# Patient Record
Sex: Male | Born: 1960
Health system: Southern US, Community
[De-identification: ages and names within clinical notes are randomized; demographics above are authoritative.]

## PROBLEM LIST (undated history)

## (undated) HISTORY — PX: VASECTOMY: SHX75

---

## 2000-01-25 ENCOUNTER — Emergency Department (HOSPITAL_COMMUNITY): Admission: EM | Admit: 2000-01-25 | Discharge: 2000-01-25 | Payer: Self-pay | Admitting: Emergency Medicine

## 2000-01-25 ENCOUNTER — Encounter: Payer: Self-pay | Admitting: Emergency Medicine

## 2001-01-25 ENCOUNTER — Encounter: Payer: Self-pay | Admitting: Emergency Medicine

## 2001-01-25 ENCOUNTER — Emergency Department (HOSPITAL_COMMUNITY): Admission: EM | Admit: 2001-01-25 | Discharge: 2001-01-25 | Payer: Self-pay | Admitting: Emergency Medicine

## 2001-06-08 ENCOUNTER — Other Ambulatory Visit: Admission: RE | Admit: 2001-06-08 | Discharge: 2001-06-08 | Payer: Self-pay | Admitting: Urology

## 2002-10-06 HISTORY — PX: TONSILLECTOMY: SUR1361

## 2004-05-20 ENCOUNTER — Ambulatory Visit (HOSPITAL_BASED_OUTPATIENT_CLINIC_OR_DEPARTMENT_OTHER): Admission: RE | Admit: 2004-05-20 | Discharge: 2004-05-20 | Payer: Self-pay | Admitting: Internal Medicine

## 2005-05-07 ENCOUNTER — Ambulatory Visit (HOSPITAL_COMMUNITY): Admission: RE | Admit: 2005-05-07 | Discharge: 2005-05-08 | Payer: Self-pay | Admitting: Otolaryngology

## 2012-01-02 ENCOUNTER — Observation Stay (HOSPITAL_COMMUNITY): Payer: BC Managed Care – PPO

## 2012-01-02 ENCOUNTER — Observation Stay (HOSPITAL_COMMUNITY)
Admission: EM | Admit: 2012-01-02 | Discharge: 2012-01-02 | Disposition: A | Discharge status: Home / Self Care | Payer: BC Managed Care – PPO | Attending: Emergency Medicine | Admitting: Emergency Medicine

## 2012-01-02 ENCOUNTER — Other Ambulatory Visit: Payer: Self-pay

## 2012-01-02 ENCOUNTER — Emergency Department (HOSPITAL_COMMUNITY): Payer: BC Managed Care – PPO

## 2012-01-02 ENCOUNTER — Encounter (HOSPITAL_COMMUNITY): Payer: Self-pay | Admitting: *Deleted

## 2012-01-02 DIAGNOSIS — R079 Chest pain, unspecified: Principal | ICD-10-CM | POA: Insufficient documentation

## 2012-01-02 DIAGNOSIS — Z79899 Other long term (current) drug therapy: Secondary | ICD-10-CM | POA: Insufficient documentation

## 2012-01-02 LAB — POCT I-STAT, CHEM 8
BUN: 21 mg/dL (ref 6–23)
Chloride: 107 mEq/L (ref 96–112)
Potassium: 4.1 mEq/L (ref 3.5–5.1)
Sodium: 141 mEq/L (ref 135–145)

## 2012-01-02 LAB — POCT I-STAT TROPONIN I: Troponin i, poc: 0.01 ng/mL (ref 0.00–0.08)

## 2012-01-02 MED ORDER — METOPROLOL TARTRATE 25 MG PO TABS
100.0000 mg | ORAL_TABLET | Freq: Once | ORAL | Status: AC
  Administered 2012-01-02: 100 mg via ORAL
  Filled 2012-01-02: qty 4

## 2012-01-02 MED ORDER — METOPROLOL TARTRATE 1 MG/ML IV SOLN
5.0000 mg | Freq: Once | INTRAVENOUS | Status: AC
  Administered 2012-01-02: 5 mg via INTRAVENOUS

## 2012-01-02 MED ORDER — METOPROLOL TARTRATE 1 MG/ML IV SOLN
INTRAVENOUS | Status: AC
  Administered 2012-01-02: 5 mg via INTRAVENOUS
  Filled 2012-01-02: qty 5

## 2012-01-02 MED ORDER — METOPROLOL TARTRATE 25 MG PO TABS
50.0000 mg | ORAL_TABLET | Freq: Once | ORAL | Status: AC
  Administered 2012-01-02: 50 mg via ORAL
  Filled 2012-01-02: qty 2

## 2012-01-02 MED ORDER — NITROGLYCERIN 0.4 MG SL SUBL
0.4000 mg | SUBLINGUAL_TABLET | Freq: Once | SUBLINGUAL | Status: AC
  Administered 2012-01-02: 0.4 mg via SUBLINGUAL

## 2012-01-02 MED ORDER — IOHEXOL 350 MG/ML SOLN
80.0000 mL | Freq: Once | INTRAVENOUS | Status: AC | PRN
  Administered 2012-01-02: 80 mL via INTRAVENOUS

## 2012-01-02 NOTE — ED Notes (Signed)
Pt reports having central CP x3days, worse with exertion with associated SOB and lightheadedness.

## 2012-01-02 NOTE — ED Provider Notes (Signed)
Patient placed in CDU under chest pain protocol.  Patient scheduled for coronary CT today.  Patient currently pain-free.  Initial cardiac markers negative.  Lungs CTA bilaterally.  S1/S2, RRR, no murmur.  Abdomen soft, bowel sounds present.  Strong distal pulses palpated all extremities.  12 lead reviewed, no indication of ischemia.  Monitor reveals NSR without ectopy.  Treatment and diagnostic plan discussed with patient.  4:37 PM Received CT results from radiologist.  Normal study-- 1. No coronary artery disease. The patient's total coronary artery calcium score is 0.  2. No significant extracardiac findings.  3. Left-sided coronary artery dominance.   Results discussed with patient.  Patient will be discharged home with outpatient cardiology follow-up.  Jimmye Norman, NP 01/02/12 [phone]

## 2012-01-02 NOTE — ED Notes (Signed)
Pt d/c home in NAD. Pt voiced understanding of follow up care. Pt ambulated with quick, steady gait.

## 2012-01-02 NOTE — ED Notes (Signed)
Pt placed on monitor, continuous pulse oximetry and blood pressure cuff; family at bedside 

## 2012-01-02 NOTE — ED Provider Notes (Signed)
History     CSN: [id number]  Arrival date & time 01/02/12  1026   First MD Initiated Contact with Patient 01/02/12 1156      Chief Complaint  Patient presents with  . Chest Pain     HPI Pt reports 3day hx of intermittent midsternal chest pain. Non radiating, described as pressure. EKG NSR. Pt reports pain 0/10.  Pain started today when he was loading some bulge.  Patient has no previous history of coronary disease.  Questionable family history of heart disease.  Patient has hyperlipidemia but no hypertension no diabetes does not smoke.   History reviewed. No pertinent past medical history.  History reviewed. No pertinent past surgical history.  History reviewed. No pertinent family history.  History  Substance Use Topics  . Smoking status: Never Smoker   . Smokeless tobacco: Not on file  . Alcohol Use: Yes     occ      Review of Systems  All other systems reviewed and are negative.    Allergies  Review of patient's allergies indicates no known allergies.  Home Medications   Current Outpatient Rx  Name Route Sig Dispense Refill  . VITAMIN C PO Oral Take 1 tablet by mouth daily.    . IBUPROFEN 200 MG PO TABS Oral Take 200 mg by mouth daily.      BP 105/74  Pulse 58  Temp(Src) 98 F (36.7 C) (Oral)  Resp 16  SpO2 99%  Physical Exam  Nursing note and vitals reviewed. Constitutional: He is oriented to person, place, and time. He appears well-developed and well-nourished. No distress.  HENT:  Head: Normocephalic and atraumatic.  Eyes: Pupils are equal, round, and reactive to light.  Neck: Normal range of motion.  Cardiovascular: Normal rate and intact distal pulses.         Date: 01/02/2012  Rate: 88  Rhythm: normal sinus rhythm  QRS Axis: normal  Intervals: normal  ST/T Wave abnormalities: normal  Conduction Disutrbances: none  Narrative Interpretation: unremarkable      Pulmonary/Chest: No respiratory distress.  Abdominal: Normal appearance. He  exhibits no distension.  Musculoskeletal: Normal range of motion.  Neurological: He is alert and oriented to person, place, and time. No cranial nerve deficit.  Skin: Skin is warm and dry. No rash noted.  Psychiatric: He has a normal mood and affect. His behavior is normal.    ED Course  Procedures (including critical care time)   Labs Reviewed  POCT I-STAT, CHEM 8  POCT I-STAT TROPONIN I   Dg Chest 2 View  01/02/2012  *RADIOLOGY REPORT*  Clinical Data: 51 year old male with chest pain and pressure. Lightheaded.  CHEST - 2 VIEW  Comparison: None.  Findings: Normal lung volumes. Normal cardiac size and mediastinal contours.  Visualized tracheal air column is within normal limits. The lungs are clear.  No pneumothorax or effusion.  Mild summation artifacts suspected at the left base on the frontal view.  No acute osseous abnormality identified.  IMPRESSION: Negative, no acute cardiopulmonary abnormality.  Original Report Authenticated By: Harley Hallmark, M.D.   Ct Heart Morp W/cta Cor W/score W/ca W/cm &/or Wo/cm  01/02/2012  *RADIOLOGY REPORT*  INDICATION:  Central chest pain for 3 days.  Worse with exertion. Short of breath and lightheadedness.  CT ANGIOGRAPHY OF THE HEART, CORONARY ARTERY, STRUCTURE, AND MORPHOLOGY  CONTRAST: 80mL OMNIPAQUE IOHEXOL 350 MG/ML IV SOLN  COMPARISON:  Plain film of earlier today.  TECHNIQUE:  CT angiography of the coronary vessels was  performed on a 256 channel system using prospective ECG gating.  A scout and noncontrast exam (for calcium scoring) were performed.  Circulation time was measured using a test bolus.  Coronary CTA was performed with sub mm slice collimation during portions of the cardiac cycle after prior injection of iodinated contrast.  Imaging post processing was performed on an independent workstation creating multiplanar and 3-D images, and quantitative analysis of the heart and coronary arteries.  Note that this exam targets the heart and the chest  was not imaged in its entirety.  PREMEDICATION: Lopressor 150 mg, P.O. Lopressor 5.0 mg, IV Nitroglycerin 0.4 mcg, sublingual.  FINDINGS: Technical quality:  Excellent  Heart rate:  57  CORONARY ARTERIES: Left main coronary artery:  Moderate length vessel, which arises from the left coronary cusp and is within normal limits. Left anterior descending:  Gives rise to a moderate-sized branching first diagonal, which is normal.  Gives rise to a small branching second diagonal., which is patent.  Supplies a third diagonal, which is diminutive.  Continues as a long vessel over the cardiac apex. Left circumflex:  A large, dominant vessel.  Gives rise to SA node branch.  Immediately gives rise to a large marginal branch, which is normal.  This does have an intramuscular course.  Gives rise to a second diminutive marginal.  Continues as a moderate sized vessel to supply posterolateral branches and the PDA. Right coronary artery:  Small to moderate, nondominant vessel. Arises from right coronary cusp.  Gives rise to a small to moderate first marginal and then terminates in a second marginal branch. Negative. Posterior descending artery:  Supplied by the left. Patent. Dominance:  Left-sided  CORONARY CALCIUM:  Total Agatston Score:  0  AORTA AND PULMONARY MEASUREMENTS: Aortic root (21 - 40 mm):             25  at the annulus             33  at the sinuses of Valsalva             24  at the sinotubular junction Ascending aorta ( <  40 mm):  30 Descending aorta ( <  40 mm):  23 Main pulmonary artery:  ( <  30 mm):  21  EXTRACARDIAC FINDINGS: Lung windows demonstrate minimal subpleural nodularity in the right lower lobe on image 27, likely an area of scarring.  Soft tissue windows demonstrate no aortic dissection.  No pericardial or pleural effusion.  No imaged thoracic adenopathy. No central pulmonary embolism, on this non-dedicated study.  Limited abdominal imaging demonstrates no significant findings.  No acute osseous  abnormality.  IMPRESSION:  1.  No coronary artery disease.  The patient's total coronary artery calcium score is 0. 2.  No significant extracardiac findings. 3.  Left-sided coronary artery dominance.  Report was called to CDU mid level at [phone] at 4:30 p.m. on 01/02/2012.  Original Report Authenticated By: Consuello Bossier, M.D.     1. Chest pain       MDM          Nelia Shi, MD 01/02/12 [phone]

## 2012-01-02 NOTE — Discharge Instructions (Signed)
Chest Pain Observation It is often hard to give a specific diagnosis for the cause of chest pain. Your symptoms had a chance of being caused by inadequate oxygen delivery to your heart (angina). Angina that is not treated or evaluated can lead to a heart attack (myocardial infarction, MI) or death. Blood tests, electrocardiograms, and X-rays may have been done to help determine a possible cause of your chest pain. After evaluation and observation, your caregiver has determined that it is unlikely your pain was caused by angina. However, a full evaluation of your pain needs to be completed. You need to follow up with caregivers or diagnostic testing as directed. It is very important to keep your follow-up appointments. Not keeping your follow-up appointments could result in permanent heart damage, disability, or death. If there is any problem keeping your follow-up appointments, you must call your caregiver. HOME CARE INSTRUCTIONS  Due to the slight chance that your pain could be angina, it is important to follow healthy lifestyle habits and follow your caregiver's treatment plan:  Maintain a healthy weight.   Stay physically active and exercise regularly.   Decrease your salt intake.   Eat a diet low in saturated fats and cholesterol. Avoid foods fried in oil or made with fat. Talk to a dietician to learn about heart healthy foods.   Increase your fiber intake by including whole grains, vegetable, and fruits in your diet.   Avoid situations that cause stress, anger, or depression.   Take medication as advised by your caregiver. Report any side effects to your caregiver. Do not stop medications or adjust the dosages on your own.   Quit smoking. Do not use nicotine patches or gum until you check with your caregiver.   Keep your blood pressure, blood sugar, and cholesterol levels within normal limits.   Limit alcohol intake to no more than 1 drink per day for nonpregnant women and 2  drinks per day for men.   Stop abusing drugs.  SEEK MEDICAL CARE IF: You have severe chest pain or pressure which may include symptoms such as:  Pain or pressure in the arms, neck, jaw, or back.   Profuse sweating.   Feeling sick to your stomach (nauseous).   Feeling short of breath while at rest.   Having a fast or irregular heartbeat.   You have chest pain that does not get better after rest or after taking your usual medicine.   You wake from sleep with chest pain.   You feel dizzy, faint, or experience extreme fatigue.   You notice increasing shortness of breath during rest, sleep, or with activity.   You are unable to sleep because you cannot breathe.   You develop a frequent cough or you are coughing up blood.   You have severe back or abdominal pain, are nauseated, or throw up (vomit).   You develop severe weakness, dizziness, fainting, or chills.  Any of these symptoms may represent a serious problem that is an emergency. Do not wait to see if the symptoms will go away. Call your local emergency services (911 in the [place] Do not drive yourself to the hospital. MAKE SURE YOU:  Understand these instructions.   Will watch your condition.   Will get help right away if you are not doing well or get worse.  Document Released: 10/25/2010 Document Revised: 09/11/2011 Document Reviewed: 10/25/2010 [facility] Patient Information 2012 [place], [place].   RESOURCE GUIDE   No Primary Care Doctor Call [facility]  [phone] Other agencies that provide inexpensive medical care    [facility]  [phone]    [facility]  [phone]    [facility]  [phone]    [facility]  [phone]    [facility]  [phone]    [facility] [place] [facility]  [phone]

## 2012-01-02 NOTE — ED Notes (Signed)
Pt reports 3day hx of intermittent midsternal chest pain. Non radiating, described as pressure. EKG NSR. Pt reports pain 6/10.

## 2018-01-12 ENCOUNTER — Encounter: Payer: Self-pay | Admitting: Physician Assistant

## 2018-01-12 ENCOUNTER — Other Ambulatory Visit: Payer: Self-pay

## 2018-01-12 ENCOUNTER — Ambulatory Visit: Payer: 59 | Admitting: Physician Assistant

## 2018-01-12 VITALS — BP 120/82 | HR 76 | Temp 98.8°F | Ht 72.44 in | Wt 166.2 lb

## 2018-01-12 DIAGNOSIS — Z13220 Encounter for screening for lipoid disorders: Secondary | ICD-10-CM

## 2018-01-12 DIAGNOSIS — Z1329 Encounter for screening for other suspected endocrine disorder: Secondary | ICD-10-CM | POA: Diagnosis not present

## 2018-01-12 DIAGNOSIS — Z114 Encounter for screening for human immunodeficiency virus [HIV]: Secondary | ICD-10-CM

## 2018-01-12 DIAGNOSIS — Z23 Encounter for immunization: Secondary | ICD-10-CM | POA: Diagnosis not present

## 2018-01-12 DIAGNOSIS — Z8042 Family history of malignant neoplasm of prostate: Secondary | ICD-10-CM | POA: Diagnosis not present

## 2018-01-12 DIAGNOSIS — Z1159 Encounter for screening for other viral diseases: Secondary | ICD-10-CM

## 2018-01-12 DIAGNOSIS — Z0000 Encounter for general adult medical examination without abnormal findings: Secondary | ICD-10-CM

## 2018-01-12 DIAGNOSIS — Z1389 Encounter for screening for other disorder: Secondary | ICD-10-CM | POA: Diagnosis not present

## 2018-01-12 DIAGNOSIS — Z130 Encounter for screening for diseases of the blood and blood-forming organs and certain disorders involving the immune mechanism: Secondary | ICD-10-CM | POA: Diagnosis not present

## 2018-01-12 DIAGNOSIS — Z13228 Encounter for screening for other metabolic disorders: Secondary | ICD-10-CM | POA: Diagnosis not present

## 2018-01-12 MED ORDER — ZOSTER VAC RECOMB ADJUVANTED 50 MCG/0.5ML IM SUSR: 0.5000 mL | Freq: Once | INTRAMUSCULAR | 0 refills | Status: AC

## 2018-01-12 NOTE — Patient Instructions (Addendum)
Return in 1-2 weeks for fasting labs.   Health Maintenance, Male A healthy lifestyle and preventive care is important for your health and wellness. Ask your health care provider about what schedule of regular examinations is right for you. What should I know about weight and diet? Eat a Healthy Diet  Eat plenty of vegetables, fruits, whole grains, low-fat dairy products, and lean protein.  Do not eat a lot of foods high in solid fats, added sugars, or salt.  Maintain a Healthy Weight Regular exercise can help you achieve or maintain a healthy weight. You should:  Do at least 150 minutes of exercise each week. The exercise should increase your heart rate and make you sweat (moderate-intensity exercise).  Do strength-training exercises at least twice a week.  Watch Your Levels of Cholesterol and Blood Lipids  Have your blood tested for lipids and cholesterol every 5 years starting at 57 years of age. If you are at high risk for heart disease, you should start having your blood tested when you are 57 years old. You may need to have your cholesterol levels checked more often if: ? Your lipid or cholesterol levels are high. ? You are older than 57 years of age. ? You are at high risk for heart disease.  What should I know about cancer screening? Many types of cancers can be detected early and may often be prevented. Lung Cancer  You should be screened every year for lung cancer if: ? You are a current smoker who has smoked for at least 30 years. ? You are a former smoker who has quit within the past 15 years.  Talk to your health care provider about your screening options, when you should start screening, and how often you should be screened.  Colorectal Cancer  Routine colorectal cancer screening usually begins at 57 years of age and should be repeated every 5-10 years until you are 57 years old. You may need to be screened more often if early forms of precancerous polyps or small  growths are found. Your health care provider may recommend screening at an earlier age if you have risk factors for colon cancer.  Your health care provider may recommend using home test kits to check for hidden blood in the stool.  A small camera at the end of a tube can be used to examine your colon (sigmoidoscopy or colonoscopy). This checks for the earliest forms of colorectal cancer.  Prostate and Testicular Cancer  Depending on your age and overall health, your health care provider may do certain tests to screen for prostate and testicular cancer.  Talk to your health care provider about any symptoms or concerns you have about testicular or prostate cancer.  Skin Cancer  Check your skin from head to toe regularly.  Tell your health care provider about any new moles or changes in moles, especially if: ? There is a change in a mole's size, shape, or color. ? You have a mole that is larger than a pencil eraser.  Always use sunscreen. Apply sunscreen liberally and repeat throughout the day.  Protect yourself by wearing long sleeves, pants, a wide-brimmed hat, and sunglasses when outside.  What should I know about heart disease, diabetes, and high blood pressure?  If you are 118-57 years of age, have your blood pressure checked every 3-5 years. If you are 57 years of age or older, have your blood pressure checked every year. You should have your blood pressure measured twice-once when you  are at a hospital or clinic, and once when you are not at a hospital or clinic. Record the average of the two measurements. To check your blood pressure when you are not at a hospital or clinic, you can use: ? An automated blood pressure machine at a pharmacy. ? A home blood pressure monitor.  Talk to your health care provider about your target blood pressure.  If you are between 42-73 years old, ask your health care provider if you should take aspirin to prevent heart disease.  Have regular  diabetes screenings by checking your fasting blood sugar level. ? If you are at a normal weight and have a low risk for diabetes, have this test once every three years after the age of 67. ? If you are overweight and have a high risk for diabetes, consider being tested at a younger age or more often.  A one-time screening for abdominal aortic aneurysm (AAA) by ultrasound is recommended for men aged 65-75 years who are current or former smokers. What should I know about preventing infection? Hepatitis B If you have a higher risk for hepatitis B, you should be screened for this virus. Talk with your health care provider to find out if you are at risk for hepatitis B infection. Hepatitis C Blood testing is recommended for:  Everyone born from [birth year] through 1965.  Anyone with known risk factors for hepatitis C.  Sexually Transmitted Diseases (STDs)  You should be screened each year for STDs including gonorrhea and chlamydia if: ? You are sexually active and are younger than 57 years of age. ? You are older than 57 years of age and your health care provider tells you that you are at risk for this type of infection. ? Your sexual activity has changed since you were last screened and you are at an increased risk for chlamydia or gonorrhea. Ask your health care provider if you are at risk.  Talk with your health care provider about whether you are at high risk of being infected with HIV. Your health care provider may recommend a prescription medicine to help prevent HIV infection.  What else can I do?  Schedule regular health, dental, and eye exams.  Stay current with your vaccines (immunizations).  Do not use any tobacco products, such as cigarettes, chewing tobacco, and e-cigarettes. If you need help quitting, ask your health care provider.  Limit alcohol intake to no more than 2 drinks per day. One drink equals 12 ounces of beer, 5 ounces of wine, or 1 ounces of hard liquor.  Do not use  street drugs.  Do not share needles.  Ask your health care provider for help if you need support or information about quitting drugs.  Tell your health care provider if you often feel depressed.  Tell your health care provider if you have ever been abused or do not feel safe at home. This information is not intended to replace advice given to you by your health care provider. Make sure you discuss any questions you have with your health care provider. Document Released: 03/20/2008 Document Revised: 05/21/2016 Document Reviewed: 06/26/2015 Elsevier Interactive Patient Education  2018 [employer].     IF you received an x-ray today, you will receive an invoice from [facility]. Please contact [facility] at [phone] with questions or concerns regarding your invoice.   IF you received labwork today, you will receive an invoice from [facility]. Please contact [facility] at [phone] with questions or concerns regarding your  invoice.   Our billing staff will not be able to assist you with questions regarding bills from these companies.  You will be contacted with the lab results as soon as they are available. The fastest way to get your results is to activate your My Chart account. Instructions are located on the last page of this paperwork. If you have not heard from [place] regarding the results in 2 weeks, please contact this office.

## 2018-01-12 NOTE — Progress Notes (Signed)
Luke Gregory  MRN: [id number] DOB: [birth year]  Subjective:  Luke Gregory is a 57 y.o. male who presents for annual physical exam and to establish care. Does not have a PCP. He is not fasting today.   Diet: 3 full meals a day. Boiled eggs, grits, fish, white and red meat, vegetables, fruits, and grains. Drinks one cup of coffee in morning and water throughout the day. Does not eat a lot of dairy. Does not take any multivitamins.  Exercise: Has not worked out since the new year. He does walk a lot with his job. Typically walks 2-3 miles at work every day.   Sleep: 6 hours a night.   BM: 1-2 per day.  Urinary sx: Denies nocturia, urinary frequency, urgency, weak stream, urinary dribbling, and erectile dysfunction. Has FH of prostate cancer in father.  Chronic medical conditions: 1)HLD: Years ago but has not had blood work in years so does not know.   Last annual physical: Never Last dental exam: 2017, brushes twice daily Last vision exam: 2016 Last colonoscopy:2017, normal  Vaccinations      Tetanus: >10 years ago      Zostavax: Never   There are no active problems to display for this patient.   No current outpatient medications on file prior to visit.   No current facility-administered medications on file prior to visit.     No Known Allergies  Social History   Socioeconomic History  . Marital status: Married    Spouse name: Lattie Haw  . Number of children: 3  . Years of education: College  . Highest education level: Bachelor's degree (e.g., BA, AB, BS)  Occupational History  . Not on file  Social Needs  . Financial resource strain: Not hard at all  . Food insecurity:    Worry: Never true    Inability: Never true  . Transportation needs:    Medical: No    Non-medical: No  Tobacco Use  . Smoking status: Never Smoker  . Smokeless tobacco: Never Used  Substance and Sexual Activity  . Alcohol use: Yes    Alcohol/week: 7.2 oz    Types: 12 Standard drinks or  equivalent per week    Comment: occ  . Drug use: Never  . Sexual activity: Yes    Partners: Female    Birth control/protection: Surgical    Comment: with monagamous partner  Lifestyle  . Physical activity:    Days per week: 0 days    Minutes per session: 0 min  . Stress: Not at all  Relationships  . Social connections:    Talks on phone: More than three times a week    Gets together: More than three times a week    Attends religious service: Never    Active member of club or organization: No    Attends meetings of clubs or organizations: Never    Relationship status: Married  Other Topics Concern  . Not on file  Social History Narrative   Pt is from [place]. Lives at home with wife and one son. Works for his own company. Has a good friend group.     Past Surgical History:  Procedure Laterality Date  . TONSILLECTOMY  2004  . VASECTOMY      Family History  Problem Relation Age of Onset  . Lung cancer Mother   . COPD Mother   . Heart disease Father   . Diabetes Father   . Prostate cancer Father   . Multiple  sclerosis Sister     Review of Systems  Constitutional: Negative for activity change, appetite change, chills, diaphoresis, fatigue, fever and unexpected weight change.  HENT: Negative for congestion, dental problem, drooling, ear discharge, ear pain, facial swelling, hearing loss, mouth sores, nosebleeds, postnasal drip, rhinorrhea, sinus pressure, sinus pain, sneezing, sore throat, tinnitus, trouble swallowing and voice change.   Eyes: Negative for photophobia, pain, discharge, redness, itching and visual disturbance.  Respiratory: Negative for apnea, cough, choking, chest tightness, shortness of breath, wheezing and stridor.   Cardiovascular: Negative for chest pain, palpitations and leg swelling.  Gastrointestinal: Negative for abdominal distention, abdominal pain, anal bleeding, blood in stool, constipation, diarrhea, nausea, rectal pain and vomiting.    Endocrine: Negative for cold intolerance, heat intolerance, polydipsia, polyphagia and polyuria.  Genitourinary: Negative for decreased urine volume, difficulty urinating, discharge, dysuria, enuresis, flank pain, frequency, genital sores, hematuria, penile pain, penile swelling, scrotal swelling, testicular pain and urgency.  Musculoskeletal: Negative for arthralgias, back pain, gait problem, joint swelling, myalgias, neck pain and neck stiffness.  Skin: Negative for color change, pallor, rash and wound.  Allergic/Immunologic: Negative for environmental allergies, food allergies and immunocompromised state.  Neurological: Negative for dizziness, tremors, seizures, syncope, facial asymmetry, speech difficulty, weakness, light-headedness, numbness and headaches.  Hematological: Negative for adenopathy. Does not bruise/bleed easily.  Psychiatric/Behavioral: Negative for agitation, behavioral problems, confusion, decreased concentration, dysphoric mood, hallucinations, self-injury, sleep disturbance and suicidal ideas. The patient is not nervous/anxious and is not hyperactive.     Objective:  BP 120/82 (BP Location: Left Arm, Patient Position: Sitting, Cuff Size: Normal)   Pulse 76   Temp 98.8 F (37.1 C) (Oral)   Ht 6' 0.44" (1.84 m)   Wt 166 lb 3.2 oz (75.4 kg)   SpO2 98%   BMI 22.27 kg/m   Physical Exam  Constitutional: He is oriented to person, place, and time. He appears well-developed and well-nourished.  HENT:  Head: Normocephalic and atraumatic.  Right Ear: Hearing, tympanic membrane, external ear and ear canal normal.  Left Ear: Hearing, tympanic membrane, external ear and ear canal normal.  Nose: Nose normal.  Mouth/Throat: Uvula is midline, oropharynx is clear and moist and mucous membranes are normal. No oropharyngeal exudate.  Eyes: Pupils are equal, round, and reactive to light. Conjunctivae and EOM are normal.  Neck: Trachea normal and normal range of motion.   Cardiovascular: Normal rate, regular rhythm, normal heart sounds and intact distal pulses.  Pulmonary/Chest: Effort normal and breath sounds normal.  Abdominal: Soft. Normal appearance and bowel sounds are normal.  Genitourinary:  Genitourinary Comments: Deferred GU exam   Musculoskeletal: Normal range of motion.  Lymphadenopathy:       Head (right side): No submental, no submandibular, no tonsillar, no preauricular, no posterior auricular and no occipital adenopathy present.       Head (left side): No submental, no submandibular, no tonsillar, no preauricular, no posterior auricular and no occipital adenopathy present.    He has no cervical adenopathy.       Right: No supraclavicular adenopathy present.       Left: No supraclavicular adenopathy present.  Neurological: He is alert and oriented to person, place, and time. He has normal strength and normal reflexes.  Skin: Skin is warm and dry.  Psychiatric: He has a normal mood and affect.  Vitals reviewed.  No exam data present  Assessment and Plan :  Discussed healthy lifestyle, diet, exercise, preventative care, vaccinations, and addressed patient's concerns. Plan for follow up in one  year. Otherwise, plan for specific conditions below. 1. Annual physical exam Return within next two weeks for fasting labs.  2. Screening, anemia, deficiency, iron - CBC with Differential/Platelet; Future  3. Screening cholesterol level - Lipid panel; Future  4. Screening for thyroid disorder - TSH; Future  5. Screening for metabolic disorder - OIL57+VJKQ; Future  6. Screening for hematuria or proteinuria - POCT urinalysis dipstick; Future  7. Screening for HIV (human immunodeficiency virus) - HIV antibody; Future  8. Need for hepatitis C screening test - Hepatitis C antibody; Future  9. Need for shingles vaccine - Zoster Vaccine Adjuvanted [facility]) injection; Inject 0.5 mLs into the muscle once for 1 dose.  Dispense: 0.5 mL; Refill:  0  10. Family history of prostate cancer Declines both PSA test and rectal exam.   Tenna Delaine, PA-C  [facility] 01/12/2018 3:15 PM

## 2018-01-12 NOTE — Addendum Note (Signed)
Addended by: Shon HaleWELLS, Marrion Finan L on: 01/12/2018 03:19 PM   Modules accepted: Orders

## 2018-01-13 ENCOUNTER — Ambulatory Visit: Payer: 59

## 2018-01-13 DIAGNOSIS — Z13220 Encounter for screening for lipoid disorders: Secondary | ICD-10-CM

## 2018-01-13 DIAGNOSIS — Z1389 Encounter for screening for other disorder: Secondary | ICD-10-CM

## 2018-01-13 DIAGNOSIS — Z13228 Encounter for screening for other metabolic disorders: Secondary | ICD-10-CM

## 2018-01-13 DIAGNOSIS — Z1329 Encounter for screening for other suspected endocrine disorder: Secondary | ICD-10-CM

## 2018-01-13 DIAGNOSIS — Z130 Encounter for screening for diseases of the blood and blood-forming organs and certain disorders involving the immune mechanism: Secondary | ICD-10-CM

## 2018-01-13 DIAGNOSIS — Z1159 Encounter for screening for other viral diseases: Secondary | ICD-10-CM | POA: Diagnosis not present

## 2018-01-13 DIAGNOSIS — Z114 Encounter for screening for human immunodeficiency virus [HIV]: Secondary | ICD-10-CM

## 2018-01-13 LAB — POCT URINALYSIS DIP (MANUAL ENTRY)
BILIRUBIN UA: NEGATIVE
Blood, UA: NEGATIVE
GLUCOSE UA: NEGATIVE mg/dL
Ketones, POC UA: NEGATIVE mg/dL
LEUKOCYTES UA: NEGATIVE
NITRITE UA: NEGATIVE
Protein Ur, POC: NEGATIVE mg/dL
Spec Grav, UA: 1.02 (ref 1.010–1.025)
Urobilinogen, UA: 0.2 E.U./dL
pH, UA: 5.5 (ref 5.0–8.0)

## 2018-01-14 LAB — CBC WITH DIFFERENTIAL/PLATELET
BASOS ABS: 0 10*3/uL (ref 0.0–0.2)
Basos: 1 %
EOS (ABSOLUTE): 0.2 10*3/uL (ref 0.0–0.4)
Eos: 4 %
Hematocrit: 41.7 % (ref 37.5–51.0)
Hemoglobin: 14 g/dL (ref 13.0–17.7)
IMMATURE GRANS (ABS): 0 10*3/uL (ref 0.0–0.1)
IMMATURE GRANULOCYTES: 0 %
LYMPHS: 45 %
Lymphocytes Absolute: 2 10*3/uL (ref 0.7–3.1)
MCH: 31 pg (ref 26.6–33.0)
MCHC: 33.6 g/dL (ref 31.5–35.7)
MCV: 92 fL (ref 79–97)
MONOS ABS: 0.4 10*3/uL (ref 0.1–0.9)
Monocytes: 9 %
NEUTROS ABS: 1.8 10*3/uL (ref 1.4–7.0)
NEUTROS PCT: 41 %
Platelets: 242 10*3/uL (ref 150–379)
RBC: 4.52 x10E6/uL (ref 4.14–5.80)
RDW: 13.7 % (ref 12.3–15.4)
WBC: 4.4 10*3/uL (ref 3.4–10.8)

## 2018-01-14 LAB — CMP14+EGFR
A/G RATIO: 1.8 (ref 1.2–2.2)
ALT: 49 IU/L — AB (ref 0–44)
AST: 41 IU/L — AB (ref 0–40)
Albumin: 4.3 g/dL (ref 3.5–5.5)
Alkaline Phosphatase: 101 IU/L (ref 39–117)
BUN/Creatinine Ratio: 13 (ref 9–20)
BUN: 10 mg/dL (ref 6–24)
Bilirubin Total: 0.3 mg/dL (ref 0.0–1.2)
CO2: 25 mmol/L (ref 20–29)
Calcium: 9.5 mg/dL (ref 8.7–10.2)
Chloride: 104 mmol/L (ref 96–106)
Creatinine, Ser: 0.76 mg/dL (ref 0.76–1.27)
GFR calc Af Amer: 118 mL/min/{1.73_m2} (ref >59)
GFR calc non Af Amer: 102 mL/min/{1.73_m2} (ref >59)
GLOBULIN, TOTAL: 2.4 g/dL (ref 1.5–4.5)
Glucose: 80 mg/dL (ref 65–99)
POTASSIUM: 4.8 mmol/L (ref 3.5–5.2)
SODIUM: 143 mmol/L (ref 134–144)
Total Protein: 6.7 g/dL (ref 6.0–8.5)

## 2018-01-14 LAB — LIPID PANEL
CHOL/HDL RATIO: 2.7 ratio (ref 0.0–5.0)
Cholesterol, Total: 226 mg/dL — ABNORMAL HIGH (ref 100–199)
HDL: 85 mg/dL (ref >39)
LDL CALC: 126 mg/dL — AB (ref 0–99)
TRIGLYCERIDES: 75 mg/dL (ref 0–149)
VLDL CHOLESTEROL CAL: 15 mg/dL (ref 5–40)

## 2018-01-14 LAB — TSH: TSH: 2.01 u[IU]/mL (ref 0.450–4.500)

## 2018-01-14 LAB — HIV ANTIBODY (ROUTINE TESTING W REFLEX): HIV SCREEN 4TH GENERATION: NONREACTIVE

## 2018-01-14 LAB — HEPATITIS C ANTIBODY: Hep C Virus Ab: <0.1 s/co ratio (ref 0.0–0.9)

## 2018-08-02 DIAGNOSIS — H40033 Anatomical narrow angle, bilateral: Secondary | ICD-10-CM | POA: Diagnosis not present

## 2018-08-02 DIAGNOSIS — H04123 Dry eye syndrome of bilateral lacrimal glands: Secondary | ICD-10-CM | POA: Diagnosis not present

## 2020-10-24 ENCOUNTER — Emergency Department (HOSPITAL_BASED_OUTPATIENT_CLINIC_OR_DEPARTMENT_OTHER)
Admission: EM | Admit: 2020-10-24 | Discharge: 2020-10-24 | Disposition: A | Discharge status: Home / Self Care | Payer: Worker's Compensation | Attending: Emergency Medicine | Admitting: Emergency Medicine

## 2020-10-24 ENCOUNTER — Emergency Department (HOSPITAL_BASED_OUTPATIENT_CLINIC_OR_DEPARTMENT_OTHER): Payer: Worker's Compensation

## 2020-10-24 ENCOUNTER — Other Ambulatory Visit: Payer: Self-pay

## 2020-10-24 ENCOUNTER — Encounter (HOSPITAL_BASED_OUTPATIENT_CLINIC_OR_DEPARTMENT_OTHER): Payer: Self-pay

## 2020-10-24 DIAGNOSIS — W000XXA Fall on same level due to ice and snow, initial encounter: Secondary | ICD-10-CM | POA: Insufficient documentation

## 2020-10-24 DIAGNOSIS — M25532 Pain in left wrist: Secondary | ICD-10-CM | POA: Diagnosis present

## 2020-10-24 DIAGNOSIS — S52502A Unspecified fracture of the lower end of left radius, initial encounter for closed fracture: Secondary | ICD-10-CM | POA: Diagnosis not present

## 2020-10-24 NOTE — ED Triage Notes (Signed)
Pt reports slipped on the ice and fell onto his Left Wrist.

## 2020-10-24 NOTE — ED Provider Notes (Signed)
[facility] EMERGENCY DEPARTMENT Provider Note   CSN: [id number] Arrival date & time: 10/24/20  0755     History Chief Complaint  Patient presents with  . Wrist Pain  . Fall    Luke Gregory is a 60 y.o. male.  HPI Patient presents after a slip on the ice and fall.  Landed onto her left wrist.  Pain in the left wrist.  No other injury.  No neck back chest or abdominal pain.  Does have some abrasions on the hand and forearm but states these are from a new puppy that he has at home.  No numbness or weakness but pain when he moves the wrist.    History reviewed. No pertinent past medical history.  There are no problems to display for this patient.   Past Surgical History:  Procedure Laterality Date  . TONSILLECTOMY  2004  . VASECTOMY         Family History  Problem Relation Age of Onset  . Lung cancer Mother   . COPD Mother   . Heart disease Father   . Diabetes Father   . Prostate cancer Father   . Multiple sclerosis Sister     Social History   Tobacco Use  . Smoking status: Never Smoker  . Smokeless tobacco: Never Used  Vaping Use  . Vaping Use: Never used  Substance Use Topics  . Alcohol use: Yes    Alcohol/week: 12.0 standard drinks    Types: 12 Standard drinks or equivalent per week    Comment: occ  . Drug use: Never    Home Medications Prior to Admission medications   Not on File    Allergies    Patient has no known allergies.  Review of Systems   Review of Systems  Constitutional: Negative for appetite change.  HENT: Negative for congestion.   Respiratory: Negative for shortness of breath.   Cardiovascular: Negative for chest pain.  Gastrointestinal: Negative for abdominal pain.  Musculoskeletal: Negative for back pain and neck pain.       Left wrist pain and swelling.  Skin: Positive for wound.  Neurological: Negative for weakness and numbness.  Hematological: Does not bruise/bleed easily.    Physical Exam Updated Vital  Signs BP (!) 154/88   Pulse 94   Temp 98.4 F (36.9 C) (Oral)   Resp 18   Ht 6' (1.829 m)   Wt 74.8 kg   SpO2 96%   BMI 22.38 kg/m   Physical Exam Vitals and nursing note reviewed.  Constitutional:      Appearance: Normal appearance.  HENT:     Head: Atraumatic.  Eyes:     General: No scleral icterus. Chest:     Chest wall: No tenderness.  Abdominal:     Tenderness: There is no abdominal tenderness.  Musculoskeletal:     Cervical back: Neck supple.     Comments: Tenderness over left wrist particularly of the radial aspect.  Abrasions to hand and wrist.  Skin otherwise intact.  Reported puppy bites for the cause of the abrasions.  Ring on fourth finger.  Patient will remove.  Sensation grossly intact over hand.  Skin:    Capillary Refill: Capillary refill takes less than 2 seconds.  Neurological:     Mental Status: He is oriented to person, place, and time.     ED Results / Procedures / Treatments   Labs (all labs ordered are listed, but only abnormal results are displayed) Labs Reviewed - No  data to display  EKG None  Radiology DG Wrist Complete Left  Result Date: 10/24/2020 CLINICAL DATA:  RIGHT wrist pain and swelling, fell this morning and tried to catch himself with LEFT hand EXAM: LEFT WRIST - COMPLETE 3+ VIEW COMPARISON:  None FINDINGS: Osseous mineralization low normal. Joint spaces preserved. Tiny avulsion fracture at tip of ulnar styloid process. Nondisplaced distal LEFT radial metaphyseal fracture. No additional fracture, dislocation, or bone destruction. Metallic foreign body 6 mm length at the ulnar margin of the LEFT hand at the level of the fifth MCP joint. IMPRESSION: Nondisplaced distal LEFT radial metaphyseal fracture. Tiny avulsion fracture at tip of LEFT ulnar styloid process. Electronically Signed   By: Ulyses Southward M.D.   On: 10/24/2020 08:34    Procedures Procedures (including critical care time)  Medications Ordered in ED Medications - No data  to display  ED Course  I have reviewed the triage vital signs and the nursing notes.  Pertinent labs & imaging results that were available during my care of the patient were reviewed by me and considered in my medical decision making (see chart for details).    MDM Rules/Calculators/A&P                          Patient with mechanical fall on ice.  Nondisplaced radial metaphyseal fracture and small avulsion fracture of left ulnar styloid.  Immobilized.  Outpatient follow-up with sports medicine. Final Clinical Impression(s) / ED Diagnoses Final diagnoses:  Closed fracture of distal end of left radius, unspecified fracture morphology, initial encounter    Rx / DC Orders ED Discharge Orders    None       Benjiman Core, MD 10/24/20 [phone]

## 2020-10-24 NOTE — ED Notes (Signed)
Ring is off left hand

## 2020-10-31 ENCOUNTER — Ambulatory Visit (INDEPENDENT_AMBULATORY_CARE_PROVIDER_SITE_OTHER): Payer: Worker's Compensation | Admitting: Family Medicine

## 2020-10-31 ENCOUNTER — Telehealth: Payer: Self-pay | Admitting: Family Medicine

## 2020-10-31 ENCOUNTER — Ambulatory Visit (HOSPITAL_BASED_OUTPATIENT_CLINIC_OR_DEPARTMENT_OTHER)
Admission: RE | Admit: 2020-10-31 | Discharge: 2020-10-31 | Disposition: A | Discharge status: Home / Self Care | Payer: Worker's Compensation | Source: Ambulatory Visit | Attending: Family Medicine | Admitting: Family Medicine

## 2020-10-31 ENCOUNTER — Other Ambulatory Visit: Payer: Self-pay

## 2020-10-31 VITALS — BP 100/60 | Ht 72.0 in | Wt 168.0 lb

## 2020-10-31 DIAGNOSIS — S52592A Other fractures of lower end of left radius, initial encounter for closed fracture: Secondary | ICD-10-CM | POA: Insufficient documentation

## 2020-10-31 DIAGNOSIS — S52502A Unspecified fracture of the lower end of left radius, initial encounter for closed fracture: Secondary | ICD-10-CM | POA: Insufficient documentation

## 2020-10-31 NOTE — Patient Instructions (Signed)
Nice to meet you Please try tylenol   Please send me a message in MyChart with any questions or updates.  Please see me back in 2 weeks.   --Dr. Jordan Likes

## 2020-10-31 NOTE — Progress Notes (Signed)
  BENNO BRENSINGER - 60 y.o. male MRN [id number]  Date of birth: [birth year]  SUBJECTIVE:  Including CC & ROS.  No chief complaint on file.   Luke Gregory is a 60 y.o. male that is presenting with left wrist pain after a fall at work.  He sustained a fracture was placed in a sugar tong splint at the urgent care.  Pain is under control and has little to no pain currently.  No numbness or tingling.  Independent review of the left wrist x-ray from 1/19 shows a nondisplaced distal radius fracture.   Review of Systems See HPI   HISTORY: Past Medical, Surgical, Social, and Family History Reviewed & Updated per EMR.   Pertinent Historical Findings include:  No past medical history on file.  Past Surgical History:  Procedure Laterality Date  . TONSILLECTOMY  2004  . VASECTOMY      Family History  Problem Relation Age of Onset  . Lung cancer Mother   . COPD Mother   . Heart disease Father   . Diabetes Father   . Prostate cancer Father   . Multiple sclerosis Sister     Social History   Socioeconomic History  . Marital status: Married    Spouse name: Misty Stanley  . Number of children: 3  . Years of education: College  . Highest education level: Bachelor's degree (e.g., BA, AB, BS)  Occupational History  . Not on file  Tobacco Use  . Smoking status: Never Smoker  . Smokeless tobacco: Never Used  Vaping Use  . Vaping Use: Never used  Substance and Sexual Activity  . Alcohol use: Yes    Alcohol/week: 12.0 standard drinks    Types: 12 Standard drinks or equivalent per week    Comment: occ  . Drug use: Never  . Sexual activity: Yes    Partners: Female    Birth control/protection: Surgical    Comment: with monagamous partner  Other Topics Concern  . Not on file  Social History Narrative   Pt is from [place]. Lives at home with wife and one son. Works for his own company. Has a good friend group.    Social Determinants of Health   Financial Resource Strain: Not on file   Food Insecurity: Not on file  Transportation Needs: Not on file  Physical Activity: Not on file  Stress: Not on file  Social Connections: Not on file  Intimate Partner Violence: Not on file     PHYSICAL EXAM:  VS: BP 100/60   Ht 6' (1.829 m)   Wt 168 lb (76.2 kg)   BMI 22.78 kg/m  Physical Exam Gen: NAD, alert, cooperative with exam, well-appearing MSK:  Left wrist limited Obvious swelling and ecchymosis of the distal radius. Limited flexion and extension. Normal thumb abduction. Neurovascular intact     ASSESSMENT & PLAN:   Closed fracture of left distal radius Injury occurred on 1/19.  Injury occurred at work.  Independent review of the x-ray today shows nondisplaced.  Radiology review shows possible more complicated picture. -Counseled on supportive care. - exos brace today. -We will pursue CT scan

## 2020-10-31 NOTE — Telephone Encounter (Signed)
Informed that CT scan showed possible variation and intra articular process. Will obtain CT to better evaluate fracture.   Luke Rude, MD [facility] 10/31/2020, 2:53 PM

## 2020-10-31 NOTE — Assessment & Plan Note (Signed)
Injury occurred on 1/19.  Injury occurred at work.  Independent review of the x-ray today shows nondisplaced.  Radiology review shows possible more complicated picture. -Counseled on supportive care. - exos brace today. -We will pursue CT scan

## 2020-11-07 ENCOUNTER — Telehealth (INDEPENDENT_AMBULATORY_CARE_PROVIDER_SITE_OTHER): Payer: Worker's Compensation | Admitting: Family Medicine

## 2020-11-07 ENCOUNTER — Encounter: Payer: Self-pay | Admitting: Family Medicine

## 2020-11-07 ENCOUNTER — Other Ambulatory Visit: Payer: Self-pay

## 2020-11-07 DIAGNOSIS — S52592D Other fractures of lower end of left radius, subsequent encounter for closed fracture with routine healing: Secondary | ICD-10-CM

## 2020-11-07 NOTE — Progress Notes (Signed)
Virtual Visit via Video Note  I connected with Luke Gregory on 11/07/20 at  8:00 AM EST by a video enabled telemedicine application and verified that I am speaking with the correct person using two identifiers.  Location: Patient: work Provider: office   I discussed the limitations of evaluation and management by telemedicine and the availability of in person appointments. The patient expressed understanding and agreed to proceed.  History of Present Illness:  Luke Gregory is a 60 year old male that is following up after the CT scan of his left wrist.  It was showing a mildly comminuted fracture of the left distal radius with extension into the margin of the distal radial ulnar joint.  Also showing a small avulsion of the distal ulnar styloid of unknown age.   Observations/Objective:  Gen: NAD, alert, cooperative with exam, well-appearing   Assessment and Plan:  Closed fracture left distal radius: Injury occurred on 1/19.  Injury occurred at work.  CT scan was showing a commniuted fracture involving the joint  -Counseled supportive care - referral to ortho   Follow Up Instructions:    I discussed the assessment and treatment plan with the patient. The patient was provided an opportunity to ask questions and all were answered. The patient agreed with the plan and demonstrated an understanding of the instructions.   The patient was advised to call back or seek an in-person evaluation if the symptoms worsen or if the condition fails to improve as anticipated.    Luke Gandy, MD

## 2020-11-07 NOTE — Assessment & Plan Note (Addendum)
Injury occurred on 1/19.  Injury occurred at work.  CT scan was showing a commniuted fracture involving the joint  -Counseled supportive care - referral to ortho

## 2020-11-14 ENCOUNTER — Telehealth: Payer: Worker's Compensation | Admitting: Family Medicine

## 2020-11-14 ENCOUNTER — Other Ambulatory Visit: Payer: Self-pay

## 2020-11-28 NOTE — Addendum Note (Signed)
Addended by: Shon Hale on: 11/28/2020 10:20 AM   Modules accepted: Orders

## 2021-11-05 IMAGING — DX DG WRIST COMPLETE 3+V*L*
4 series · 4 of 4 positions shown · non-contrast
Comparison: 10/24/2020
COMPARISON: 10/24/2020

Addendum:
CLINICAL DATA: LEFT wrist pain 1 week after being casted for a
distal radial fracture

EXAM:
LEFT WRIST - COMPLETE 3+ VIEW

[wrist pa]
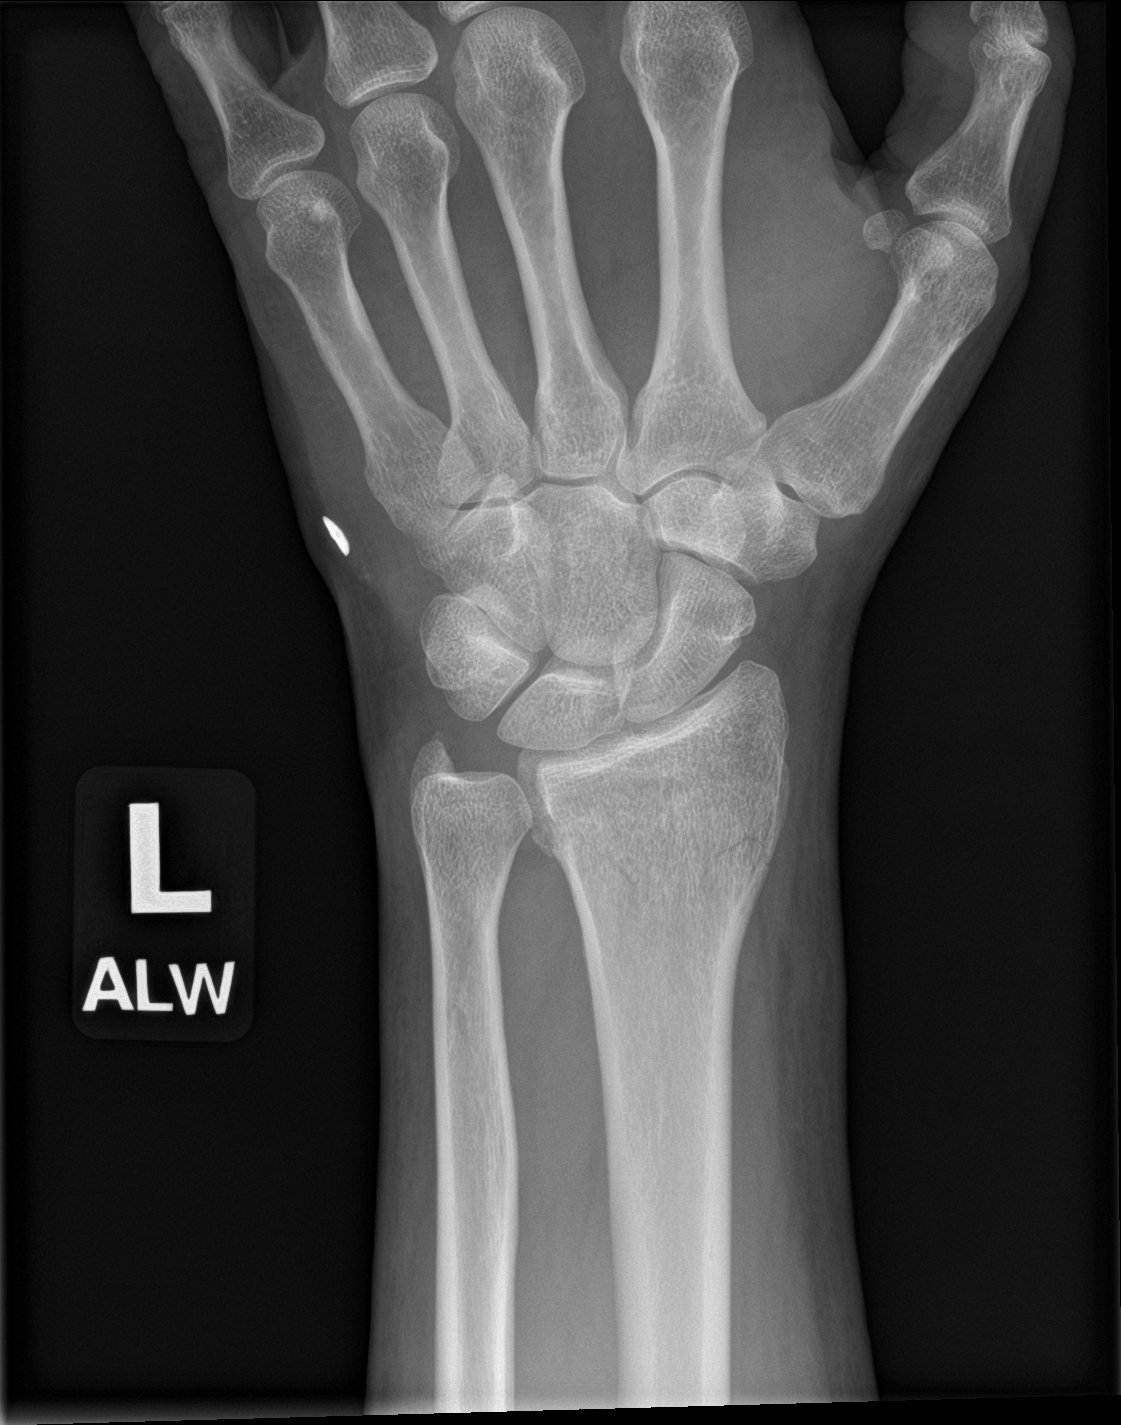

[wrist obl]
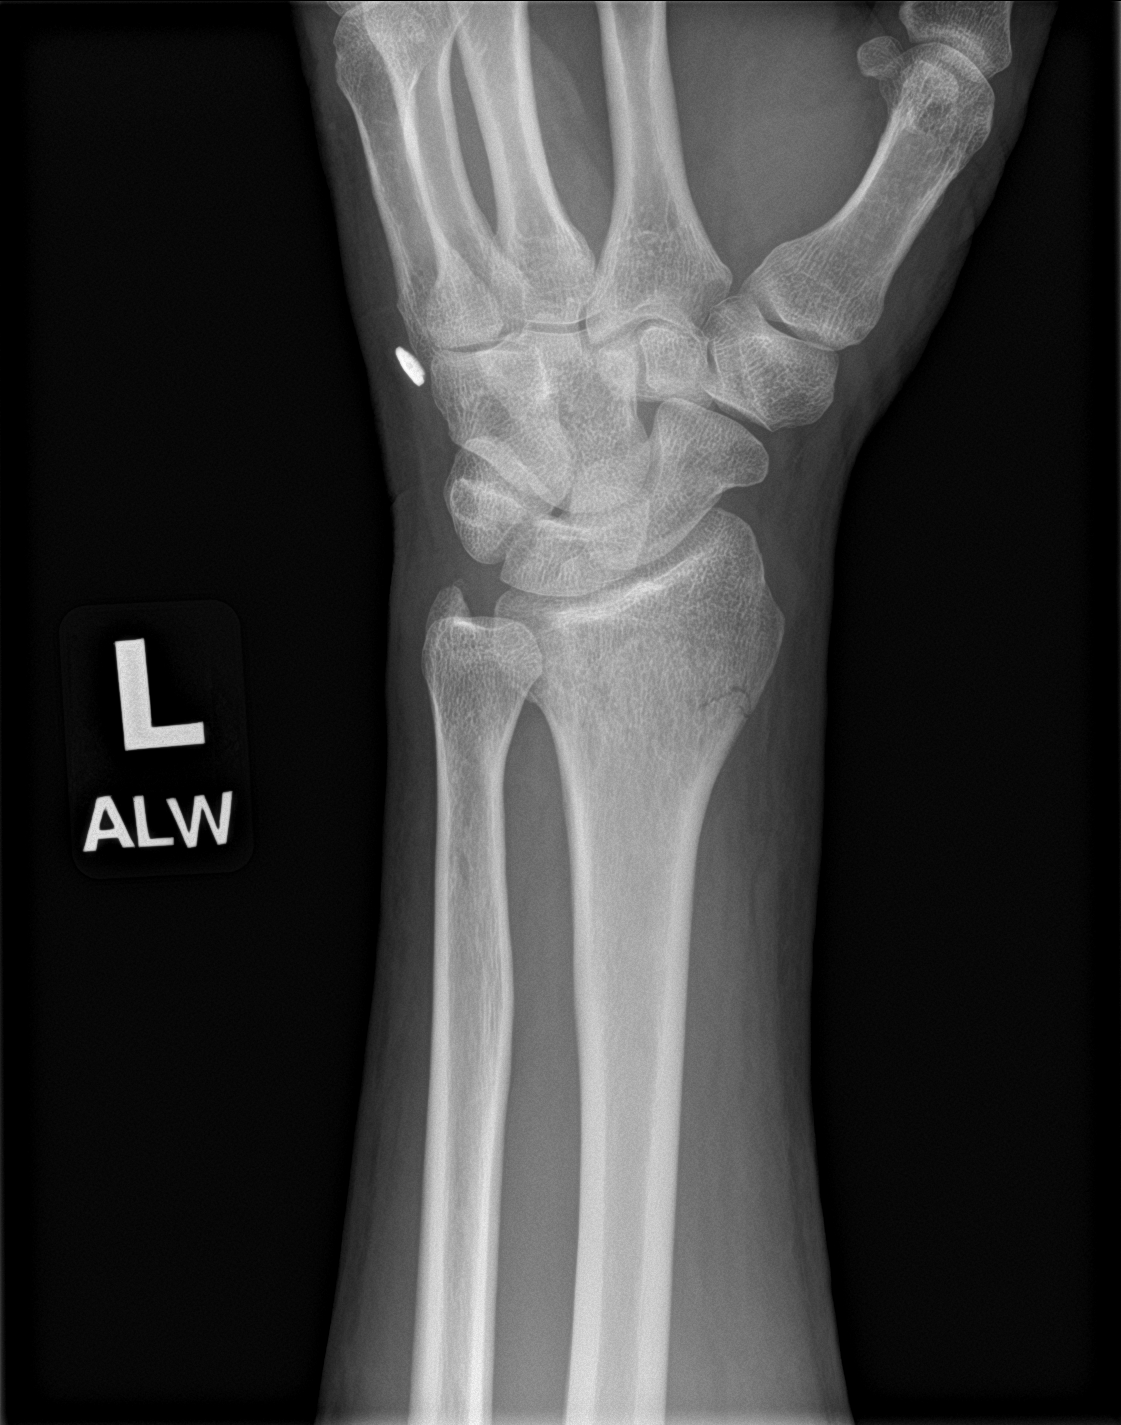

[wrist lat]
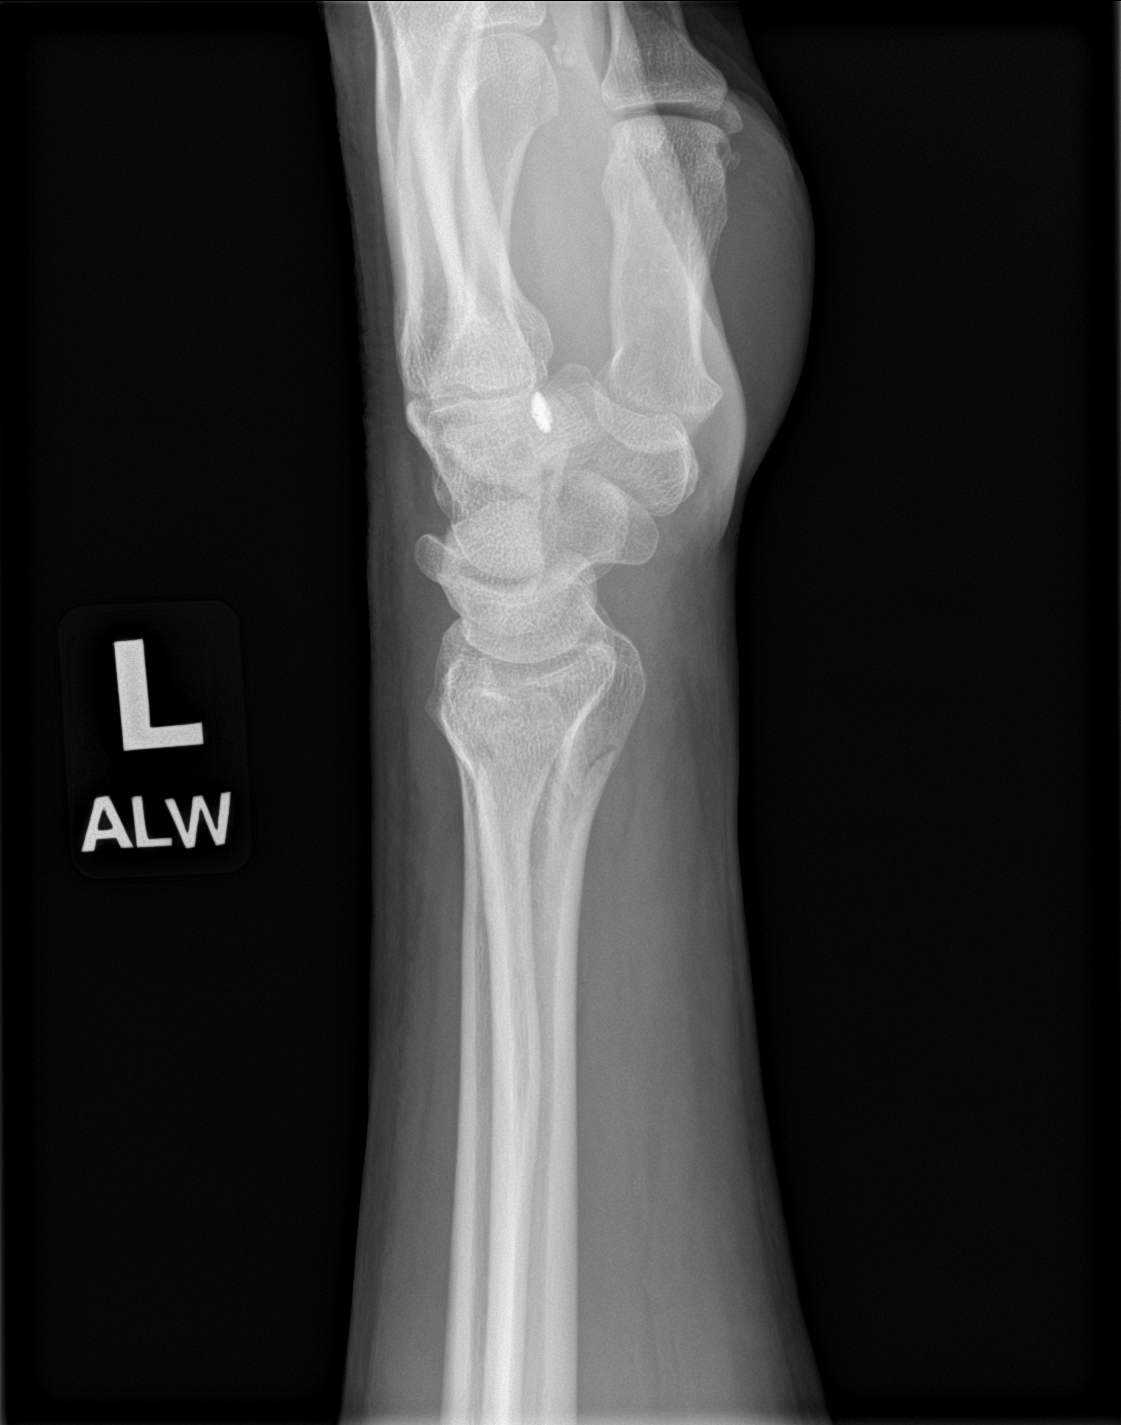

[wrist navicular]
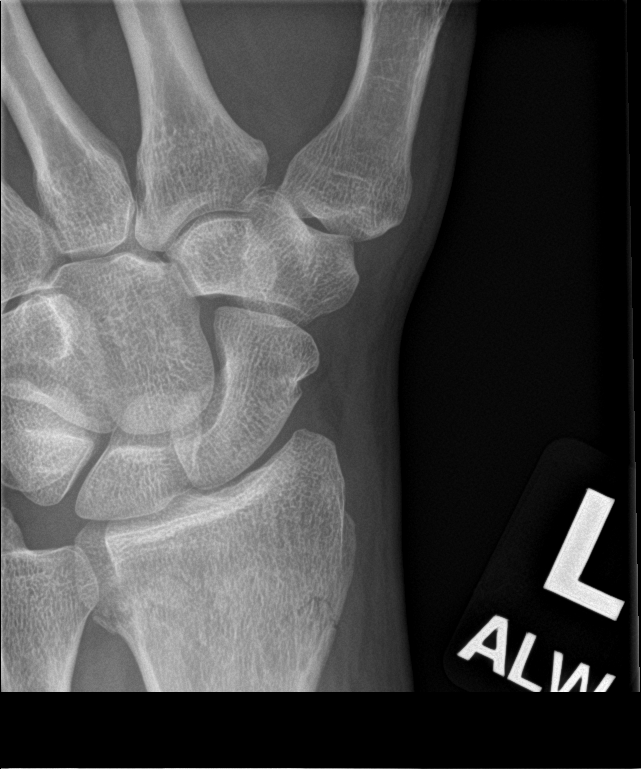

[4 of 4 positions shown; findings below may reference images not displayed]

FINDINGS: Impacted LEFT distal radius fracture with horizontal component in
the radial metaphysis. Potential T-shaped variation with fracture
line potentially extending into the mid radial articular surface
best seen on AP view.

Radiopaque 6 mm metallic foreign body along the ulnar aspect of the
wrist in the soft tissues with similar appearance.
IMPRESSION: 1. Impacted LEFT distal radius fracture without change in alignment.
2. Potential T-shaped variation with fracture line potentially
extending into the mid radial articular surface. CT may be helpful
for further assessment.
3. Radiopaque foreign body in the soft tissues appears metallic and
is unchanged as described.

results will be called to the ordering clinician or representative
by the Radiologist Assistant, and communication documented in the
PACS or [REDACTED].

ADDENDUM:
Small avulsion of the ulnar styloid as before.

*** End of Addendum ***
FINDINGS: Impacted LEFT distal radius fracture with horizontal component in
the radial metaphysis. Potential T-shaped variation with fracture
line potentially extending into the mid radial articular surface
best seen on AP view.

Radiopaque 6 mm metallic foreign body along the ulnar aspect of the
wrist in the soft tissues with similar appearance.
IMPRESSION: 1. Impacted LEFT distal radius fracture without change in alignment.
2. Potential T-shaped variation with fracture line potentially
extending into the mid radial articular surface. CT may be helpful
for further assessment.
3. Radiopaque foreign body in the soft tissues appears metallic and
is unchanged as described.

results will be called to the ordering clinician or representative
by the Radiologist Assistant, and communication documented in the
PACS or [REDACTED].

## 2022-07-01 NOTE — Progress Notes (Unsigned)
[place] Phone: [phone] Subjective:   [name], am serving as a scribe for Dr. Hulan Saas.  I'm seeing this patient by the request  of:  Patient, No Pcp Per  CC: Bilateral knee pain  BTD:[id number]  Luke Gregory is a 61 y.o. male coming in with complaint of B knee pain. Patient states that pain is throughout the joint. Pain worse when sitting for prolonged periods and at night. Had cortisone injection 17 years ago. Has not tried any tx for pain recently.       No past medical history on file. Past Surgical History:  Procedure Laterality Date   TONSILLECTOMY  2004   VASECTOMY     Social History   Socioeconomic History   Marital status: Married    Spouse name: Lattie Haw   Number of children: 3   Years of education: [employer] education level: Bachelor's degree (e.g., BA, AB, BS)  Occupational History   Not on file  Tobacco Use   Smoking status: Never   Smokeless tobacco: Never  Vaping Use   Vaping Use: Never used  Substance and Sexual Activity   Alcohol use: Yes    Alcohol/week: 12.0 standard drinks of alcohol    Types: 12 Standard drinks or equivalent per week    Comment: occ   Drug use: Never   Sexual activity: Yes    Partners: Female    Birth control/protection: Surgical    Comment: with monagamous partner  Other Topics Concern   Not on file  Social History Narrative   Pt is from [place]. Lives at home with wife and one son. Works for his own company. Has a good friend group.    Social Determinants of Health   Financial Resource Strain: Low Risk  (01/12/2018)   Overall Financial Resource Strain (CARDIA)    Difficulty of Paying Living Expenses: Not hard at all  Food Insecurity: No Food Insecurity (01/12/2018)   Hunger Vital Sign    Worried About Running Out of Food in the Last Year: Never true    Ran Out of Food in the Last Year: Never true  Transportation Needs: No  Transportation Needs (01/12/2018)   PRAPARE - Hydrologist (Medical): No    Lack of Transportation (Non-Medical): No  Physical Activity: Inactive (01/12/2018)   Exercise Vital Sign    Days of Exercise per Week: 0 days    Minutes of Exercise per Session: 0 min  Stress: No Stress Concern Present (01/12/2018)   [place]    Feeling of Stress : Not at all  Social Connections: Somewhat Isolated (01/12/2018)   Social Connection and Isolation Panel [NHANES]    Frequency of Communication with Friends and Family: More than three times a week    Frequency of Social Gatherings with Friends and Family: More than three times a week    Attends Religious Services: Never    Marine scientist or Organizations: No    Attends Music therapist: Never    Marital Status: Married   No Known Allergies Family History  Problem Relation Age of Onset   Lung cancer Mother    COPD Mother    Heart disease Father    Diabetes Father    Prostate cancer Father    Multiple sclerosis Sister        Current Outpatient Medications (Analgesics):  meloxicam (MOBIC) 15 MG tablet, Take 1 tablet (15 mg total) by mouth daily.     Reviewed prior external information including notes and imaging from  primary care provider As well as notes that were available from care everywhere and other healthcare systems.  Past medical history, social, surgical and family history all reviewed in electronic medical record.  No pertanent information unless stated regarding to the chief complaint.   Review of Systems:  No headache, visual changes, nausea, vomiting, diarrhea, constipation, dizziness, abdominal pain, skin rash, fevers, chills, night sweats, weight loss, swollen lymph nodes, body aches, joint swelling, chest pain, shortness of breath, mood changes. POSITIVE muscle aches  Objective  Blood pressure 108/72, pulse  91, height 6' (1.829 m), weight 171 lb (77.6 kg), SpO2 97 %.   General: No apparent distress alert and oriented x3 mood and affect normal, dressed appropriately.  HEENT: Pupils equal, extraocular movements intact  Respiratory: Patient's speak in full sentences and does not appear short of breath  Cardiovascular: No lower extremity edema, non tender, no erythema   Knees show the patient does have some spurring noted on the medial aspect of the knee.  Mild tenderness to palpation in the area.  Crepitus noted with range of motion no significant instability  Limited muscular skeletal ultrasound was performed and interpreted by Hulan Saas, M   Limited ultrasound of patient's knees bilaterally showed trace hypoechoic changes in the patellofemoral joint.  Mild narrowing of the patellofemoral joint and mild narrowing of the medial joint space left greater than right.  Mild degenerative changes of the meniscus.  Minimal displacement noted. Impression: Nonspecific mild arthritis and degenerative changes of the meniscus  After informed written and verbal consent, patient was seated on exam table. Right knee was prepped with alcohol swab and utilizing anterolateral approach, patient's right knee space was injected with 4:1  marcaine 0.5%: Kenalog 40mg /dL. Patient tolerated the procedure well without immediate complications.  After informed written and verbal consent, patient was seated on exam table. Left knee was prepped with alcohol swab and utilizing anterolateral approach, patient's left knee space was injected with 4:1  marcaine 0.5%: Kenalog 40mg /dL. Patient tolerated the procedure well without immediate complications.    Impression and Recommendations:     The above documentation has been reviewed and is accurate and complete Luke Pulley, DO

## 2022-07-02 ENCOUNTER — Ambulatory Visit (INDEPENDENT_AMBULATORY_CARE_PROVIDER_SITE_OTHER): Payer: BC Managed Care – PPO | Admitting: Family Medicine

## 2022-07-02 ENCOUNTER — Ambulatory Visit: Payer: Self-pay

## 2022-07-02 ENCOUNTER — Ambulatory Visit (INDEPENDENT_AMBULATORY_CARE_PROVIDER_SITE_OTHER): Payer: BC Managed Care – PPO

## 2022-07-02 ENCOUNTER — Encounter: Payer: Self-pay | Admitting: Family Medicine

## 2022-07-02 VITALS — BP 108/72 | HR 91 | Ht 72.0 in | Wt 171.0 lb

## 2022-07-02 DIAGNOSIS — M25562 Pain in left knee: Secondary | ICD-10-CM | POA: Diagnosis not present

## 2022-07-02 DIAGNOSIS — M25561 Pain in right knee: Secondary | ICD-10-CM | POA: Diagnosis not present

## 2022-07-02 DIAGNOSIS — G8929 Other chronic pain: Secondary | ICD-10-CM

## 2022-07-02 MED ORDER — MELOXICAM 15 MG PO TABS: 15.0000 mg | ORAL_TABLET | Freq: Every day | ORAL | 0 refills | Status: DC

## 2022-07-02 NOTE — Patient Instructions (Addendum)
Xray today Injection in both knees Do prescribed exercises at least 3x a week Meloxicam 15mg  take on trip in case you need it

## 2022-07-02 NOTE — Assessment & Plan Note (Signed)
Patient has had more pain seem to be at night.  Seems to be only around the knees.  More anteriorly than posteriorly.  Patient tries to stay active.  States that if he puts pressure on the ground has some discomfort.  On ultrasound.  Mild effusion noted.  Given injections with patient.  Going to be traveling.  Meloxicam given as well.  We will get x-rays to further evaluate.  We will follow-up again in 6 to 8 weeks.  I do anticipate patient doing relatively well overall with conservative therapy.

## 2022-07-29 ENCOUNTER — Other Ambulatory Visit: Payer: Self-pay | Admitting: Family Medicine

## 2022-12-30 DIAGNOSIS — R070 Pain in throat: Secondary | ICD-10-CM | POA: Diagnosis not present

## 2022-12-30 DIAGNOSIS — S59912A Unspecified injury of left forearm, initial encounter: Secondary | ICD-10-CM | POA: Diagnosis not present

## 2022-12-30 DIAGNOSIS — J029 Acute pharyngitis, unspecified: Secondary | ICD-10-CM | POA: Diagnosis not present

## 2022-12-30 DIAGNOSIS — S8992XA Unspecified injury of left lower leg, initial encounter: Secondary | ICD-10-CM | POA: Diagnosis not present

## 2022-12-30 DIAGNOSIS — S59911A Unspecified injury of right forearm, initial encounter: Secondary | ICD-10-CM | POA: Diagnosis not present

## 2023-01-19 ENCOUNTER — Encounter: Payer: Self-pay | Admitting: *Deleted

## 2023-10-06 ENCOUNTER — Encounter (HOSPITAL_BASED_OUTPATIENT_CLINIC_OR_DEPARTMENT_OTHER): Payer: Self-pay

## 2023-10-06 ENCOUNTER — Ambulatory Visit (HOSPITAL_BASED_OUTPATIENT_CLINIC_OR_DEPARTMENT_OTHER)
Admission: RE | Admit: 2023-10-06 | Discharge: 2023-10-06 | Disposition: A | Discharge status: Home / Self Care | Payer: BC Managed Care – PPO | Source: Ambulatory Visit | Attending: Internal Medicine | Admitting: Internal Medicine

## 2023-10-06 VITALS — BP 159/89 | HR 83 | Temp 98.5°F | Resp 20 | Wt 165.0 lb

## 2023-10-06 DIAGNOSIS — L03114 Cellulitis of left upper limb: Secondary | ICD-10-CM | POA: Diagnosis not present

## 2023-10-06 DIAGNOSIS — M79674 Pain in right toe(s): Secondary | ICD-10-CM | POA: Diagnosis not present

## 2023-10-06 MED ORDER — PREDNISONE 20 MG PO TABS: 40.0000 mg | ORAL_TABLET | Freq: Every day | ORAL | 0 refills | Status: AC

## 2023-10-06 MED ORDER — DOXYCYCLINE HYCLATE 100 MG PO CAPS: 100.0000 mg | ORAL_CAPSULE | Freq: Two times a day (BID) | ORAL | 0 refills | Status: DC

## 2023-10-06 NOTE — ED Triage Notes (Signed)
Non-healing wound to left lower arm x 3 months. +drainage. +redness. Also having right great toe redness, pain x 3 days. Reports has had similar previously but has not had it evaluated. Concerned for gout. Patient has no family physician.

## 2023-10-06 NOTE — ED Provider Notes (Signed)
 [facility]    CSN: [id number] Arrival date & time: 10/06/23  0846      History   Chief Complaint Chief Complaint  Patient presents with   Foot Pain    Also infected  sore on left arm - Entered by patient    HPI Luke Gregory is a 62 y.o. male.   Luke Gregory is a 62 y.o. male presenting for chief complaint of pain to right great toe that started 3 days ago. Pain is associated with swelling, warmth, and redness to the dorsal DIP joint of the right great toe. No recent injuries to the great toe or abrasions. No history of gout. Admits to increased alcohol over the last 1-2 weeks given the holiday season.  No history of renal problems.  He does not take daily medications.  Additionally wound to the left forearm is started draining purulent/yellow drainage.  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  Wound opened up 2 to 3 days ago.  No recent fevers, chills, body aches, nausea, or vomiting.  Denies history of diabetes and immunosuppression, although he does not have a family medicine provider and has not had a physical in a long time.  Denies recent antibiotic or steroid use.     History reviewed. No pertinent past medical history.  Patient Active Problem List   Diagnosis Date Noted   Bilateral knee pain 07/02/2022   Closed fracture of left distal radius 10/31/2020    Past Surgical History:  Procedure Laterality Date   TONSILLECTOMY  2004   VASECTOMY         Home Medications    Prior to Admission medications   Medication Sig Start Date End Date Taking? Authorizing Provider  doxycycline  (VIBRAMYCIN ) 100 MG capsule Take 1 capsule (100 mg total) by mouth 2 (two) times daily. 10/06/23  Yes Enedelia Dorna HERO, FNP  predniSONE  (DELTASONE ) 20 MG tablet Take 2 tablets (40 mg total) by mouth daily with breakfast for 5 days. 10/06/23 10/11/23 Yes Rion Catala,  Dorna HERO, FNP  meloxicam  (MOBIC ) 15 MG tablet TAKE 1 TABLET (15 MG TOTAL) BY MOUTH DAILY. 07/29/22   Claudene Arthea HERO, DO    Family History Family History  Problem Relation Age of Onset   Lung cancer Mother    COPD Mother    Heart disease Father    Diabetes Father    Prostate cancer Father    Multiple sclerosis Sister     Social History Social History   Tobacco Use   Smoking status: Never   Smokeless tobacco: Never  Vaping Use   Vaping status: Never Used  Substance Use Topics   Alcohol use: Yes    Alcohol/week: 12.0 standard drinks of alcohol    Types: 12 Standard drinks or equivalent per week    Comment: occ   Drug use: Never     Allergies   Patient has no known allergies.   Review of Systems Review of Systems Per HPI  Physical Exam Triage Vital Signs ED Triage Vitals  Encounter Vitals Group     BP 10/06/23 0903 (!) 159/89     Systolic BP Percentile --      Diastolic BP Percentile --      Pulse Rate 10/06/23 0903 83     Resp 10/06/23 0903 20     Temp 10/06/23 0903 98.5 F (  36.9 C)     Temp Source 10/06/23 0903 Oral     SpO2 10/06/23 0903 96 %     Weight 10/06/23 0904 165 lb (74.8 kg)     Height --      Head Circumference --      Peak Flow --      Pain Score 10/06/23 0904 2     Pain Loc --      Pain Education --      Exclude from Growth Chart --    No data found.  Updated Vital Signs BP (!) 159/89 (BP Location: Right Arm)   Pulse 83   Temp 98.5 F (36.9 C) (Oral)   Resp 20   Wt 165 lb (74.8 kg)   SpO2 96%   BMI 22.38 kg/m   Visual Acuity Right Eye Distance:   Left Eye Distance:   Bilateral Distance:    Right Eye Near:   Left Eye Near:    Bilateral Near:     Physical Exam Vitals and nursing note reviewed.  Constitutional:      Appearance: He is not ill-appearing or toxic-appearing.  HENT:     Head: Normocephalic and atraumatic.     Right Ear: Hearing, tympanic membrane, ear canal and external ear normal.     Left Ear:  Hearing, tympanic membrane, ear canal and external ear normal.     Nose: Nose normal.     Mouth/Throat:     Lips: Pink.     Mouth: Mucous membranes are moist. No injury or oral lesions.     Dentition: Normal dentition.     Tongue: No lesions.     Pharynx: Oropharynx is clear. Uvula midline. No pharyngeal swelling, oropharyngeal exudate, posterior oropharyngeal erythema, uvula swelling or postnasal drip.     Tonsils: No tonsillar exudate.  Eyes:     General: Lids are normal. Vision grossly intact. Gaze aligned appropriately.     Extraocular Movements: Extraocular movements intact.     Conjunctiva/sclera: Conjunctivae normal.  Neck:     Trachea: Trachea and phonation normal.  Cardiovascular:     Rate and Rhythm: Normal rate and regular rhythm.     Pulses:          Dorsalis pedis pulses are 2+ on the right side.     Heart sounds: Normal heart sounds, S1 normal and S2 normal.  Pulmonary:     Effort: Pulmonary effort is normal. No respiratory distress.     Breath sounds: Normal breath sounds and air entry.  Musculoskeletal:     Cervical back: Neck supple.  Feet:     Right foot:     Skin integrity: Erythema and warmth present.     Comments: Warmth, erythema, and swelling to the DIP joint of the right great toe as seen in image below.  Sensation and strength intact distally.  Significantly decreased range of motion to the right great toe secondary to pain and swelling.  Capillary refill is less than 3.  No streaking or lymphangitis. Lymphadenopathy:     Cervical: No cervical adenopathy.  Skin:    General: Skin is warm and dry.     Capillary Refill: Capillary refill takes less than 2 seconds.     Findings: Wound present. No rash.          Comments: Wound to left forearm with surrounding area of erythema and underlying soft tissue firm swelling to palpation.  Wound feels warm to touch and there is evidence of purulent drainage centrally.  Neurological:     General: No focal deficit  present.     Mental Status: He is alert and oriented to person, place, and time. Mental status is at baseline.     Cranial Nerves: No dysarthria or facial asymmetry.  Psychiatric:        Mood and Affect: Mood normal.        Speech: Speech normal.        Behavior: Behavior normal.        Thought Content: Thought content normal.        Judgment: Judgment normal.    Wound to left forearm   Right great toe    UC Treatments / Results  Labs (all labs ordered are listed, but only abnormal results are displayed) Labs Reviewed - No data to display  EKG   Radiology No results found.  Procedures Procedures (including critical care time)  Medications Ordered in UC Medications - No data to display  Initial Impression / Assessment and Plan / UC Course  I have reviewed the triage vital signs and the nursing notes.  Pertinent labs & imaging results that were available during my care of the patient were reviewed by me and considered in my medical decision making (see chart for details).   1.  Toe pain Unclear etiology of right great toe pain, although suspect acute gouty arthritis given recent increase in alcohol and red meat intake. Low suspicion for cellulitis/septic arthritis. Prednisone  burst ordered to be taken as prescribed with food for the next 5 days. Elevation, ice, and compression recommended. Advised to schedule appointment with a primary care provider for follow-up and ongoing evaluation of symptoms.  2.  Cellulitis of left arm, chronic wound Wound appears to be infected.  Doxycycline  twice daily for 7 days ordered. Warm compresses and topical Vaseline recommended. Tdap is up-to-date (2 years ago). Infection return precautions discussed.  Counseled patient on potential for adverse effects with medications prescribed/recommended today, strict ER and return-to-clinic precautions discussed, patient verbalized understanding.    Final Clinical Impressions(s) / UC  Diagnoses   Final diagnoses:  Toe pain, right  Cellulitis of arm, left     Discharge Instructions      You have a skin infection. Take antibiotic as prescribed with food. Over the counter medicines as needed for pain and inflammation. Warm compresses to the site to promote wound healing. Gently rinse site 2 times a day and wash with warm soapy water. Pat dry. Do not scrub site.  I suspect your toe pain is due to gout which is buildup of uric acid crystals in the toe joint. Take prednisone  once a day for 5 days with food. No ibuprofen when taking prednisone .  Take tylenol as needed. Refer to list of low purine foods to eat to avoid future gout attacks.  If you notice new/worsening redness, swelling, pus, pain, fever/chills, or any new or worsening symptoms, return to urgent care. For severe symptoms, go to ER. Follow-up with PCP as needed. I hope you feel better!      ED Prescriptions     Medication Sig Dispense Auth. Provider   predniSONE  (DELTASONE ) 20 MG tablet Take 2 tablets (40 mg total) by mouth daily with breakfast for 5 days. 10 tablet Enedelia Dorna HERO, FNP   doxycycline  (VIBRAMYCIN ) 100 MG capsule Take 1 capsule (100 mg total) by mouth 2 (two) times daily. 20 capsule Enedelia Dorna HERO, FNP      PDMP not reviewed this encounter.   Enedelia Dorna HERO,  FNP 10/06/23 [phone]

## 2023-10-06 NOTE — Discharge Instructions (Addendum)
 You have a skin infection. Take antibiotic as prescribed with food. Over the counter medicines as needed for pain and inflammation. Warm compresses to the site to promote wound healing. Gently rinse site 2 times a day and wash with warm soapy water. Pat dry. Do not scrub site.  I suspect your toe pain is due to gout which is buildup of uric acid crystals in the toe joint. Take prednisone  once a day for 5 days with food. No ibuprofen when taking prednisone .  Take tylenol as needed. Refer to list of low purine foods to eat to avoid future gout attacks.  If you notice new/worsening redness, swelling, pus, pain, fever/chills, or any new or worsening symptoms, return to urgent care. For severe symptoms, go to ER. Follow-up with PCP as needed. I hope you feel better!

## 2023-10-22 ENCOUNTER — Encounter (HOSPITAL_BASED_OUTPATIENT_CLINIC_OR_DEPARTMENT_OTHER): Payer: Self-pay | Admitting: Student

## 2023-10-22 ENCOUNTER — Ambulatory Visit (INDEPENDENT_AMBULATORY_CARE_PROVIDER_SITE_OTHER): Payer: BC Managed Care – PPO | Admitting: Student

## 2023-10-22 VITALS — BP 132/86 | HR 77 | Temp 97.9°F | Ht 70.67 in | Wt 169.7 lb

## 2023-10-22 DIAGNOSIS — E7800 Pure hypercholesterolemia, unspecified: Secondary | ICD-10-CM | POA: Diagnosis not present

## 2023-10-22 DIAGNOSIS — M10061 Idiopathic gout, right knee: Secondary | ICD-10-CM

## 2023-10-22 DIAGNOSIS — Z131 Encounter for screening for diabetes mellitus: Secondary | ICD-10-CM

## 2023-10-22 DIAGNOSIS — Z7689 Persons encountering health services in other specified circumstances: Secondary | ICD-10-CM

## 2023-10-22 MED ORDER — INDOMETHACIN ER 75 MG PO CPCR: 75.0000 mg | ORAL_CAPSULE | Freq: Every day | ORAL | 0 refills | Status: AC

## 2023-10-22 NOTE — Patient Instructions (Addendum)
It was nice to see you today!  As we discussed in clinic I will send in the indomethacin to use once daily, please let me know in a few days if it isn't working for you.  We will communicate your lab results in the next week via either mychart or by phone.  If you have any problems before your next visit feel free to message me via MyChart (minor issues or questions) or call the office, otherwise you may reach out to schedule an office visit.  Thank you! Gerilyn Pilgrim Kaleeya Hancock, PA-C

## 2023-10-22 NOTE — Assessment & Plan Note (Addendum)
Suspect gouty arthritis of knee joint. Will assess for infectious pathologies as well. Will assess for msk issues in the future if not improved. Do not suspect msk issues as there has been no trauma, locking, popping of the knee- possible tendonitis. Order indomethacin. Order uric acid level due to repeated gout flairs. Order CBC and CMP to assess for infectious or other pathologies. Patient is to let us know if knee is not improving in a few days- will assess further if not improved.  Provided with educational handout on low purine diet.

## 2023-10-22 NOTE — Progress Notes (Addendum)
New Patient Office Visit  Subjective    Patient ID: Luke Gregory, male    DOB: Mar 19, 1961  Age: 63 y.o. MRN: [id number]  CC:  Chief Complaint  Patient presents with   Establish Care    Here to establish care. Right knee hurts. Can hardly walk. Came on New Year's Eve for gout right toe and a wound on left arm.     HPI Luke Gregory presents to establish care. Prior PCP was none. Last physical was none. he notes that he requires refills of none.  Recent UC Visit- Was seen in the urgent care on 10/05/2023 for suspected cellulitis of the left arm (treatment 100mg  doxy) and R great toe pain - suspected gout (treatment prednisone burst). Patient notes that the wound had been there since September of 2024. Has noticed that the wound is now healing and he has been washing it. Toe pain was associated with recent increase in alcohol and red meat intake. Notes that prednisone alleviated toe pain. Notes that he had a wound that he needed antibiotics for in the past as well due to lack of healing.  Elevated BP reading- BP was 159/89 at UC visit. Today's BP is 132/86, patient is still in pain, suspect it is likely decreased at home.  Screenings:  Colon Cancer: Colonoscopy 2017- 2027 Lung Cancer: no Breast Cancer: no Diabetes: Indicated HLD: Indicated. Notes that he had high cholesterol years ago.   Acute Problems:  R knee pain- Patient has history of chronic b/l knee pain related to arthritis but states that this feels different. Notes that worsening pain in knee started on Sunday. Notes onset after increasing alcohol intake again. Last couple days it has worsened. Patient states that it has been "warm" but does not noted swelling (no noted warmth on exam). No trauma. Has tried ibuprofen and ice. Slight trouble bending knee. No fever or chills. No history of kidney dz or GI bleed per patient.  07/04/22 - [place] Bilateral Knees- Nonspecific mild arthritis and degenerative changes of the  meniscus. 07/03/22 - Xray Bilateral Knees AP- Negative.  Outpatient Encounter Medications as of 10/22/2023  Medication Sig   ibuprofen (ADVIL) 200 MG tablet Take 800 mg by mouth as needed.   indomethacin (INDOCIN SR) 75 MG CR capsule Take 1 capsule (75 mg total) by mouth daily with breakfast.   [DISCONTINUED] doxycycline (VIBRAMYCIN) 100 MG capsule Take 1 capsule (100 mg total) by mouth 2 (two) times daily.   [DISCONTINUED] meloxicam (MOBIC) 15 MG tablet TAKE 1 TABLET (15 MG TOTAL) BY MOUTH DAILY.   No facility-administered encounter medications on file as of 10/22/2023.    History reviewed. No pertinent past medical history.  Past Surgical History:  Procedure Laterality Date   TONSILLECTOMY  2004   VASECTOMY      Family History  Problem Relation Age of Onset   Lung cancer Mother    COPD Mother    Heart disease Father    Diabetes Father    Prostate cancer Father    Multiple sclerosis Sister     Social History   Socioeconomic History   Marital status: Married    Spouse name: Misty Stanley   Number of children: 3   Years of education: College   Highest education level: Bachelor's degree (e.g., BA, AB, BS)  Occupational History   Not on file  Tobacco Use   Smoking status: Never    Passive exposure: Never   Smokeless tobacco: Never  Vaping Use   Vaping  status: Never Used  Substance and Sexual Activity   Alcohol use: Yes    Alcohol/week: 12.0 standard drinks of alcohol    Types: 12 Standard drinks or equivalent per week    Comment: occ   Drug use: Never   Sexual activity: Yes    Partners: Female    Birth control/protection: Surgical    Comment: with monagamous partner  Other Topics Concern   Not on file  Social History Narrative   [place]    Social Drivers of Corporate investment banker Strain: Low Risk  (01/12/2018)   Overall Financial Resource Strain (CARDIA)    Difficulty of Paying Living Expenses: Not hard at all  Food Insecurity: No Food Insecurity  (10/22/2023)   Hunger Vital Sign    Worried About Running Out of Food in the Last Year: Never true    Ran Out of Food in the Last Year: Never true  Transportation Needs: No Transportation Needs (10/22/2023)   PRAPARE - Administrator, Civil Service (Medical): No    Lack of Transportation (Non-Medical): No  Physical Activity: Inactive (01/12/2018)   Exercise Vital Sign    Days of Exercise per Week: 0 days    Minutes of Exercise per Session: 0 min  Stress: No Stress Concern Present (01/12/2018)   [employer] of Occupational Health - Occupational Stress Questionnaire    Feeling of Stress : Not at all  Social Connections: Unknown (02/14/2022)   Received from [facility]   Social Network    Social Network: Not on file  Intimate Partner Violence: Not At Risk (10/22/2023)   Humiliation, Afraid, Rape, and Kick questionnaire    Fear of Current or Ex-Partner: No    Emotionally Abused: No    Physically Abused: No    Sexually Abused: No    ROS  Per HPI      Objective    BP 132/86 (BP Location: Right Arm, Patient Position: Sitting, Cuff Size: Normal)   Pulse 77   Temp 97.9 F (36.6 C) (Oral)   Ht 5' 10.67" (1.795 m)   Wt 169 lb 11.2 oz (77 kg)   SpO2 97%   BMI 23.89 kg/m   Physical Exam Constitutional:      General: He is not in acute distress.    Appearance: Normal appearance. He is not ill-appearing or toxic-appearing.  HENT:     Head: Normocephalic and atraumatic.     Right Ear: External ear normal.     Left Ear: External ear normal.     Nose: Nose normal.     Mouth/Throat:     Mouth: Mucous membranes are moist.     Pharynx: Oropharynx is clear.  Eyes:     General: No scleral icterus.    Extraocular Movements: Extraocular movements intact.     Conjunctiva/sclera: Conjunctivae normal.     Pupils: Pupils are equal, round, and reactive to light.  Neck:     Vascular: No carotid bruit.  Cardiovascular:     Rate and Rhythm: Normal rate and  regular rhythm.     Pulses: Normal pulses.     Heart sounds: Normal heart sounds. No murmur heard.    No friction rub.  Pulmonary:     Effort: Pulmonary effort is normal. No respiratory distress.     Breath sounds: Normal breath sounds. No wheezing, rhonchi or rales.  Musculoskeletal:        General: Normal range of motion.     Cervical back:  Neck supple.     Right lower leg: No edema.     Left lower leg: No edema.     Comments: Pain with flexion of R knee. No pain upon extension of R knee. Pain to palpation directly above R patella.  Skin:    General: Skin is warm and dry.     Coloration: Skin is not jaundiced or pale.     Findings: Lesion present. No erythema or rash.     Comments: Wound on arm looks much better today and has started to close up.   Toe less erythematous today.  No breaks in skin barrier of toe or knee.  Neurological:     General: No focal deficit present.     Mental Status: He is alert.  Psychiatric:        Mood and Affect: Mood normal.        Behavior: Behavior normal.         Assessment & Plan:   Encounter to establish care  Acute idiopathic gout of right knee Assessment & Plan: Suspect gouty arthritis of knee joint. Will assess for infectious pathologies as well. Will assess for msk issues in the future if not improved. Do not suspect msk issues as there has been no trauma, locking, popping of the knee- possible tendonitis. Order indomethacin. Order uric acid level due to repeated gout flairs. Order CBC and CMP to assess for infectious or other pathologies. Patient is to let us know if knee is not improving in a few days- will assess further if not improved.  Provided with educational handout on low purine diet.   Orders: -     CBC with Differential/Platelet -     Comprehensive metabolic panel -     Uric acid -     Indomethacin ER; Take 1 capsule (75 mg total) by mouth daily with breakfast.  Dispense: 10 capsule; Refill: 0  Pure  hypercholesterolemia -     Lipid panel  Screening for diabetes mellitus -     Hemoglobin A1c   I have spent greater than 45 minutes charting, educating, diagnosing and managing this patient for this visit.   Return in about 6 weeks (around 12/03/2023) for Annual Physical.   Teryl Lucy Megan Presti, PA-C

## 2023-10-23 LAB — HEMOGLOBIN A1C
Est. average glucose Bld gHb Est-mCnc: 117 mg/dL
Hgb A1c MFr Bld: 5.7 % — ABNORMAL HIGH (ref 4.8–5.6)

## 2023-10-23 LAB — CBC WITH DIFFERENTIAL/PLATELET
Basophils Absolute: 0.1 10*3/uL (ref 0.0–0.2)
Basos: 1 %
EOS (ABSOLUTE): 0.4 10*3/uL (ref 0.0–0.4)
Eos: 5 %
Hematocrit: 44.7 % (ref 37.5–51.0)
Hemoglobin: 15.2 g/dL (ref 13.0–17.7)
Immature Grans (Abs): 0 10*3/uL (ref 0.0–0.1)
Immature Granulocytes: 1 %
Lymphocytes Absolute: 2.2 10*3/uL (ref 0.7–3.1)
Lymphs: 31 %
MCH: 31 pg (ref 26.6–33.0)
MCHC: 34 g/dL (ref 31.5–35.7)
MCV: 91 fL (ref 79–97)
Monocytes Absolute: 0.4 10*3/uL (ref 0.1–0.9)
Monocytes: 5 %
Neutrophils Absolute: 4 10*3/uL (ref 1.4–7.0)
Neutrophils: 57 %
Platelets: 304 10*3/uL (ref 150–450)
RBC: 4.91 x10E6/uL (ref 4.14–5.80)
RDW: 12.3 % (ref 11.6–15.4)
WBC: 7.1 10*3/uL (ref 3.4–10.8)

## 2023-10-23 LAB — COMPREHENSIVE METABOLIC PANEL
ALT: 42 [IU]/L (ref 0–44)
AST: 35 [IU]/L (ref 0–40)
Albumin: 4.2 g/dL (ref 3.9–4.9)
Alkaline Phosphatase: 109 [IU]/L (ref 44–121)
BUN/Creatinine Ratio: 14 (ref 10–24)
BUN: 12 mg/dL (ref 8–27)
Bilirubin Total: 0.6 mg/dL (ref 0.0–1.2)
CO2: 23 mmol/L (ref 20–29)
Calcium: 9.5 mg/dL (ref 8.6–10.2)
Chloride: 103 mmol/L (ref 96–106)
Creatinine, Ser: 0.85 mg/dL (ref 0.76–1.27)
Globulin, Total: 2.4 g/dL (ref 1.5–4.5)
Glucose: 97 mg/dL (ref 70–99)
Potassium: 4.5 mmol/L (ref 3.5–5.2)
Sodium: 142 mmol/L (ref 134–144)
Total Protein: 6.6 g/dL (ref 6.0–8.5)
eGFR: 98 mL/min/{1.73_m2} (ref >59)

## 2023-10-23 LAB — LIPID PANEL
Chol/HDL Ratio: 5.1 {ratio} — ABNORMAL HIGH (ref 0.0–5.0)
Cholesterol, Total: 246 mg/dL — ABNORMAL HIGH (ref 100–199)
HDL: 48 mg/dL (ref >39)
LDL Chol Calc (NIH): 156 mg/dL — ABNORMAL HIGH (ref 0–99)
Triglycerides: 227 mg/dL — ABNORMAL HIGH (ref 0–149)
VLDL Cholesterol Cal: 42 mg/dL — ABNORMAL HIGH (ref 5–40)

## 2023-10-23 LAB — URIC ACID: Uric Acid: 6.8 mg/dL (ref 3.8–8.4)

## 2023-10-26 ENCOUNTER — Encounter (HOSPITAL_BASED_OUTPATIENT_CLINIC_OR_DEPARTMENT_OTHER): Payer: Self-pay | Admitting: Student

## 2023-10-26 ENCOUNTER — Other Ambulatory Visit (HOSPITAL_BASED_OUTPATIENT_CLINIC_OR_DEPARTMENT_OTHER): Payer: Self-pay | Admitting: Student

## 2023-10-26 DIAGNOSIS — E782 Mixed hyperlipidemia: Secondary | ICD-10-CM | POA: Insufficient documentation

## 2023-10-26 MED ORDER — ATORVASTATIN CALCIUM 20 MG PO TABS: 20.0000 mg | ORAL_TABLET | Freq: Every day | ORAL | 1 refills | Status: AC

## 2023-11-03 DIAGNOSIS — M25562 Pain in left knee: Secondary | ICD-10-CM | POA: Diagnosis not present

## 2023-11-03 DIAGNOSIS — M25561 Pain in right knee: Secondary | ICD-10-CM | POA: Diagnosis not present

## 2023-11-09 DIAGNOSIS — M2242 Chondromalacia patellae, left knee: Secondary | ICD-10-CM | POA: Diagnosis not present

## 2023-11-09 DIAGNOSIS — M2241 Chondromalacia patellae, right knee: Secondary | ICD-10-CM | POA: Diagnosis not present

## 2023-11-09 DIAGNOSIS — R262 Difficulty in walking, not elsewhere classified: Secondary | ICD-10-CM | POA: Diagnosis not present

## 2023-12-08 ENCOUNTER — Encounter (HOSPITAL_BASED_OUTPATIENT_CLINIC_OR_DEPARTMENT_OTHER): Payer: BC Managed Care – PPO | Admitting: Student

## 2024-02-02 DIAGNOSIS — M2241 Chondromalacia patellae, right knee: Secondary | ICD-10-CM | POA: Diagnosis not present

## 2024-02-02 DIAGNOSIS — M2242 Chondromalacia patellae, left knee: Secondary | ICD-10-CM | POA: Diagnosis not present

## 2024-08-08 ENCOUNTER — Telehealth: Admitting: Physician Assistant

## 2024-08-08 DIAGNOSIS — R051 Acute cough: Secondary | ICD-10-CM | POA: Diagnosis not present

## 2024-08-08 MED ORDER — BENZONATATE 100 MG PO CAPS: 100.0000 mg | ORAL_CAPSULE | Freq: Three times a day (TID) | ORAL | 0 refills | Status: DC | PRN

## 2024-08-08 NOTE — Progress Notes (Signed)
 We are sorry that you are not feeling well.  Here is how we plan to help!  Based on your presentation I believe you most likely have A cough due to a virus.  This is called viral bronchitis and is best treated by rest, plenty of fluids and control of the cough.  You may use Ibuprofen or Tylenol as directed to help your symptoms.     In addition you may use A non-prescription cough medication called Robitussin DAC. Take 2 teaspoons every 8 hours or Delsym: take 2 teaspoons every 12 hours., A non-prescription cough medication called Mucinex DM: take 2 tablets every 12 hours., and A prescription cough medication called Tessalon Perles 100mg . You may take 1-2 capsules every 8 hours as needed for your cough.    From your responses in the eVisit questionnaire you describe inflammation in the upper respiratory tract which is causing a significant cough.  This is commonly called Bronchitis and has four common causes:   Allergies Viral Infections Acid Reflux Bacterial Infection Allergies, viruses and acid reflux are treated by controlling symptoms or eliminating the cause. An example might be a cough caused by taking certain blood pressure medications. You stop the cough by changing the medication. Another example might be a cough caused by acid reflux. Controlling the reflux helps control the cough.  USE OF BRONCHODILATOR (RESCUE) INHALERS: There is a risk from using your bronchodilator too frequently.  The risk is that over-reliance on a medication which only relaxes the muscles surrounding the breathing tubes can reduce the effectiveness of medications prescribed to reduce swelling and congestion of the tubes themselves.  Although you feel brief relief from the bronchodilator inhaler, your asthma may actually be worsening with the tubes becoming more swollen and filled with mucus.  This can delay other crucial treatments, such as oral steroid medications. If you need to use a bronchodilator inhaler  daily, several times per day, you should discuss this with your provider.  There are probably better treatments that could be used to keep your asthma under control.     HOME CARE Only take medications as instructed by your medical team. Complete the entire course of an antibiotic. Drink plenty of fluids and get plenty of rest. Avoid close contacts especially the very young and the elderly Cover your mouth if you cough or cough into your sleeve. Always remember to wash your hands A steam or ultrasonic humidifier can help congestion.   GET HELP RIGHT AWAY IF: You develop worsening fever. You become short of breath You cough up blood. Your symptoms persist after you have completed your treatment plan MAKE SURE YOU  Understand these instructions. Will watch your condition. Will get help right away if you are not doing well or get worse.  Your e-visit answers were reviewed by a board certified advanced clinical practitioner to complete your personal care plan.  Depending on the condition, your plan could have included both over the counter or prescription medications. If there is a problem please reply  once you have received a response from your provider. Your safety is important to us .  If you have drug allergies check your prescription carefully.    You can use MyChart to ask questions about today's visit, request a non-urgent call back, or ask for a work or school excuse for 24 hours related to this e-Visit. If it has been greater than 24 hours you will need to follow up with your provider, or enter a new e-Visit to address  those concerns. You will get an e-mail in the next two days asking about your experience.  I hope that your e-visit has been valuable and will speed your recovery. Thank you for using e-visits.   I have spent 5 minutes in review of e-visit questionnaire, review and updating patient chart, medical decision making and response to patient.   Harlene PEDLAR Ward,  PA-C

## 2024-09-26 ENCOUNTER — Other Ambulatory Visit (HOSPITAL_BASED_OUTPATIENT_CLINIC_OR_DEPARTMENT_OTHER): Payer: Self-pay

## 2024-09-26 ENCOUNTER — Encounter (HOSPITAL_BASED_OUTPATIENT_CLINIC_OR_DEPARTMENT_OTHER): Payer: Self-pay

## 2024-09-26 ENCOUNTER — Ambulatory Visit (HOSPITAL_BASED_OUTPATIENT_CLINIC_OR_DEPARTMENT_OTHER)
Admission: RE | Admit: 2024-09-26 | Discharge: 2024-09-26 | Disposition: A | Discharge status: Home / Self Care | Source: Ambulatory Visit | Attending: Family Medicine | Admitting: Family Medicine

## 2024-09-26 VITALS — BP 149/88 | HR 80 | Temp 98.1°F | Resp 18

## 2024-09-26 DIAGNOSIS — J069 Acute upper respiratory infection, unspecified: Secondary | ICD-10-CM

## 2024-09-26 DIAGNOSIS — R051 Acute cough: Secondary | ICD-10-CM | POA: Diagnosis not present

## 2024-09-26 DIAGNOSIS — J3489 Other specified disorders of nose and nasal sinuses: Secondary | ICD-10-CM | POA: Diagnosis not present

## 2024-09-26 LAB — POC COVID19/FLU A&B COMBO
Covid Antigen, POC: NEGATIVE
Influenza A Antigen, POC: NEGATIVE
Influenza B Antigen, POC: NEGATIVE

## 2024-09-26 MED ORDER — FLUTICASONE PROPIONATE 50 MCG/ACT NA SUSP
1.0000 | Freq: Two times a day (BID) | NASAL | 0 refills | Status: AC | PRN
  Filled 2024-09-26: qty 16, 30d supply, fill #0

## 2024-09-26 NOTE — ED Provider Notes (Addendum)
 " Luke Gregory CARE    CSN: [id number] Arrival date & time: 09/26/24  9145      History   Chief Complaint No chief complaint on file.   HPI Luke Gregory is a 63 y.o. male.   63 year old male with complaint of cough, headache, congestion.  Symptoms started on the night of 09/23/2024.  His wife and daughter had Tamiflu and neither one of them were taking it consistently so he has taken 2 doses of Tamiflu 75 mg on Sunday, 09/25/2024.  He has also taken some of their prescription cough syrup with some relief of symptoms.  He is concerned that he probably has the flu.     History reviewed. No pertinent past medical history.  Patient Active Problem List   Diagnosis Date Noted   Mixed hyperlipidemia 10/26/2023   Acute idiopathic gout of right knee 10/22/2023   Pure hypercholesterolemia 10/22/2023   Bilateral knee pain 07/02/2022    Past Surgical History:  Procedure Laterality Date   TONSILLECTOMY  2004   VASECTOMY         Home Medications    Prior to Admission medications  Medication Sig Start Date End Date Taking? Authorizing Provider  fluticasone  (FLONASE ) 50 MCG/ACT nasal spray Place 1 spray into both nostrils 2 (two) times daily as needed for rhinitis. 09/26/24 10/26/24 Yes Ival Domino, FNP  atorvastatin  (LIPITOR) 20 MG tablet Take 1 tablet (20 mg total) by mouth daily. 10/26/23   Rothfuss, Jacob T, PA-C  indomethacin  (INDOCIN  SR) 75 MG CR capsule Take 1 capsule (75 mg total) by mouth daily with breakfast. 10/22/23   Rothfuss, Lang DASEN, PA-C    Family History Family History  Problem Relation Age of Onset   Lung cancer Mother    COPD Mother    Heart disease Father    Diabetes Father    Prostate cancer Father    Multiple sclerosis Sister     Social History Social History[1]   Allergies   Patient has no known allergies.   Review of Systems Review of Systems  Constitutional:  Negative for chills and fever.  HENT:  Positive for congestion, postnasal  drip, rhinorrhea and sinus pressure. Negative for ear pain, sinus pain and sore throat.   Eyes:  Negative for pain and visual disturbance.  Respiratory:  Positive for cough.   Cardiovascular:  Negative for chest pain and palpitations.  Gastrointestinal:  Negative for abdominal pain, constipation, diarrhea, nausea and vomiting.  Genitourinary:  Negative for dysuria and hematuria.  Musculoskeletal:  Negative for arthralgias and back pain.  Skin:  Negative for color change and rash.  Neurological:  Positive for headaches. Negative for seizures and syncope.  All other systems reviewed and are negative.    Physical Exam Triage Vital Signs ED Triage Vitals  Encounter Vitals Group     BP 09/26/24 0906 (!) 149/88     Girls Systolic BP Percentile --      Girls Diastolic BP Percentile --      Boys Systolic BP Percentile --      Boys Diastolic BP Percentile --      Pulse Rate 09/26/24 0906 80     Resp 09/26/24 0906 18     Temp 09/26/24 0906 98.1 F (36.7 C)     Temp Source 09/26/24 0906 Oral     SpO2 09/26/24 0906 98 %     Weight --      Height --      Head Circumference --  Peak Flow --      Pain Score 09/26/24 0905 0     Pain Loc --      Pain Education --      Exclude from Growth Chart --    No data found.  Updated Vital Signs BP (!) 149/88 (BP Location: Right Arm)   Pulse 80   Temp 98.1 F (36.7 C) (Oral)   Resp 18   SpO2 98%   Visual Acuity Right Eye Distance:   Left Eye Distance:   Bilateral Distance:    Right Eye Near:   Left Eye Near:    Bilateral Near:     Physical Exam Vitals and nursing note reviewed.  Constitutional:      General: He is not in acute distress.    Appearance: He is well-developed. He is not ill-appearing or toxic-appearing.  HENT:     Head: Normocephalic and atraumatic.     Right Ear: Hearing, tympanic membrane, ear canal and external ear normal.     Left Ear: Hearing, tympanic membrane, ear canal and external ear normal.     Nose:  Congestion and rhinorrhea present. Rhinorrhea is clear.     Right Sinus: Maxillary sinus tenderness and frontal sinus tenderness present.     Left Sinus: Maxillary sinus tenderness and frontal sinus tenderness present.     Mouth/Throat:     Lips: Pink.     Mouth: Mucous membranes are moist.     Pharynx: Uvula midline. No oropharyngeal exudate or posterior oropharyngeal erythema.     Tonsils: No tonsillar exudate.  Eyes:     Conjunctiva/sclera: Conjunctivae normal.     Pupils: Pupils are equal, round, and reactive to light.  Cardiovascular:     Rate and Rhythm: Normal rate and regular rhythm.     Heart sounds: S1 normal and S2 normal. No murmur heard. Pulmonary:     Effort: Pulmonary effort is normal. No respiratory distress.     Breath sounds: Normal breath sounds. No decreased breath sounds, wheezing, rhonchi or rales.  Abdominal:     General: Bowel sounds are normal.     Palpations: Abdomen is soft.     Tenderness: There is no abdominal tenderness.  Musculoskeletal:        General: No swelling.     Cervical back: Neck supple.  Lymphadenopathy:     Head:     Right side of head: No submental, submandibular, tonsillar, preauricular or posterior auricular adenopathy.     Left side of head: No submental, submandibular, tonsillar, preauricular or posterior auricular adenopathy.     Cervical: Cervical adenopathy present.     Right cervical: Superficial cervical adenopathy present.     Left cervical: Superficial cervical adenopathy present.  Skin:    General: Skin is warm and dry.     Capillary Refill: Capillary refill takes less than 2 seconds.     Findings: No rash.  Neurological:     Mental Status: He is alert and oriented to person, place, and time.  Psychiatric:        Mood and Affect: Mood normal.      UC Treatments / Results  Labs (all labs ordered are listed, but only abnormal results are displayed) Labs Reviewed  POC COVID19/FLU A&B COMBO - Normal     EKG   Radiology No results found.  Procedures Procedures (including critical care time)  Medications Ordered in UC Medications - No data to display  Initial Impression / Assessment and Plan / UC Course  I have reviewed the triage vital signs and the nursing notes.  Pertinent labs & imaging results that were available during my care of the patient were reviewed by me and considered in my medical decision making (see chart for details).  Plan of Care (see discharge instructions for additional patient precautions and education): Viral upper respiratory infection with cough and sinus pressure:  Rapid flu and rapid COVID are negative.   Get plenty of fluids and rest.   Fluticasone  nasal spray, 1 spray into each nostril twice daily for 5 to 10 days.   Acetaminophen or ibuprofen, as needed for facial pressure, headache and if needed for fever. If symptoms do not completely resolve, if symptoms worsen or if new symptoms occur, follow-up here or go to primary care.  Declined work excuse.  See discharge instructions for signs and symptoms of worsening condition and reasons to go to the emergency room.  I reviewed the plan of care with the patient and/or the patient's guardian.  The patient and/or guardian had time to ask questions and acknowledged that the questions were answered.  Final Clinical Impressions(s) / UC Diagnoses   Final diagnoses:  Acute cough  Viral URI  Sinus pressure     Discharge Instructions      Viral upper respiratory infection with cough and sinus pressure: Rapid flu and rapid COVID are negative.  Get plenty of fluids and rest.  Fluticasone  nasal spray, 1 spray into each nostril twice daily for 5 to 10 days.  May use acetaminophen or ibuprofen, as needed for facial pressure, headache and if needed for fever.  If symptoms do not completely resolve, if symptoms worsen or if new symptoms occur, follow-up here or go to primary care.  Declined work excuse.  See  below for signs and symptoms of worsening condition and reasons to go to the emergency room.  Get help right away if: You have shortness of breath that gets worse. You have severe or persistent: Headache. Ear pain. Sinus pain. Chest pain. You have chronic lung disease along with any of the following: Making high-pitched whistling sounds when you breathe, most often when you breathe out (wheezing). Prolonged cough (more than 14 days). Coughing up blood. A change in your usual mucus. You have a stiff neck. You have changes in your: Vision. Hearing. Thinking. Mood. These symptoms may be an emergency. Get help right away. Call 911. Do not wait to see if the symptoms will go away. Do not drive yourself to the hospital.     ED Prescriptions     Medication Sig Dispense Auth. Provider   fluticasone  (FLONASE ) 50 MCG/ACT nasal spray Place 1 spray into both nostrils 2 (two) times daily as needed for rhinitis. 16 g Ival Domino, FNP      PDMP not reviewed this encounter.    Ival Domino, FNP 09/26/24 1009     [1]  Social History Tobacco Use   Smoking status: Never    Passive exposure: Never   Smokeless tobacco: Never  Vaping Use   Vaping status: Never Used  Substance Use Topics   Alcohol use: Yes    Alcohol/week: 12.0 standard drinks of alcohol    Types: 12 Standard drinks or equivalent per week    Comment: occ   Drug use: Never     Ival Domino, FNP 09/26/24 1012  "

## 2024-09-26 NOTE — Discharge Instructions (Addendum)
 Viral upper respiratory infection with cough and sinus pressure: Rapid flu and rapid COVID are negative.  Get plenty of fluids and rest.  Fluticasone  nasal spray, 1 spray into each nostril twice daily for 5 to 10 days.  May use acetaminophen or ibuprofen, as needed for facial pressure, headache and if needed for fever.  If symptoms do not completely resolve, if symptoms worsen or if new symptoms occur, follow-up here or go to primary care.  Declined work excuse.  See below for signs and symptoms of worsening condition and reasons to go to the emergency room.  Get help right away if: You have shortness of breath that gets worse. You have severe or persistent: Headache. Ear pain. Sinus pain. Chest pain. You have chronic lung disease along with any of the following: Making high-pitched whistling sounds when you breathe, most often when you breathe out (wheezing). Prolonged cough (more than 14 days). Coughing up blood. A change in your usual mucus. You have a stiff neck. You have changes in your: Vision. Hearing. Thinking. Mood. These symptoms may be an emergency. Get help right away. Call 911. Do not wait to see if the symptoms will go away. Do not drive yourself to the hospital.

## 2024-09-26 NOTE — ED Triage Notes (Signed)
 Pt c/o coughing, headache, congestion started Friday. He has taken his wife's tamiflu and cough medication.

## 2024-11-04 ENCOUNTER — Other Ambulatory Visit: Payer: Self-pay

## 2024-11-04 ENCOUNTER — Other Ambulatory Visit (HOSPITAL_BASED_OUTPATIENT_CLINIC_OR_DEPARTMENT_OTHER): Payer: Self-pay

## 2024-11-04 ENCOUNTER — Ambulatory Visit (HOSPITAL_BASED_OUTPATIENT_CLINIC_OR_DEPARTMENT_OTHER)
Admission: RE | Admit: 2024-11-04 | Discharge: 2024-11-04 | Disposition: A | Discharge status: Home / Self Care | Attending: Nurse Practitioner

## 2024-11-04 ENCOUNTER — Encounter (HOSPITAL_BASED_OUTPATIENT_CLINIC_OR_DEPARTMENT_OTHER): Payer: Self-pay

## 2024-11-04 VITALS — BP 130/89 | HR 84 | Temp 98.0°F | Resp 17

## 2024-11-04 DIAGNOSIS — L089 Local infection of the skin and subcutaneous tissue, unspecified: Secondary | ICD-10-CM | POA: Diagnosis not present

## 2024-11-04 MED ORDER — CEPHALEXIN 500 MG PO CAPS
500.0000 mg | ORAL_CAPSULE | Freq: Three times a day (TID) | ORAL | 0 refills | Status: AC
  Filled 2024-11-04: qty 21, 7d supply, fill #0

## 2024-11-04 NOTE — ED Triage Notes (Signed)
 Pt is here with facial swelling that gradually started a month ago with a brown spot on left side by left eye. Pt has not taken any meds to relieve discomfort.

## 2024-11-04 NOTE — ED Provider Notes (Signed)
 " [facility]    CSN: [id number] Arrival date & time: 11/04/24  9157      History   Chief Complaint Chief Complaint  Patient presents with   Facial Swelling    HPI Luke Gregory is a 64 y.o. male.   Discussed the use of AI scribe software for clinical note transcription with the patient, who gave verbal consent to proceed.   Luke Gregory presents with a facial lesion that has changed in appearance and become infected over the past few days. Approximately one month ago, he noticed what he described as a small, flat brown mole that was circular in shape. He initially thought nothing of it, attributing it to aging. The lesion remained stable until 3-4 days ago when it began to change significantly. He reports that there was no swelling initially, but the area is now swollen and appears infected. He denies any trauma to the area, stating he has not fallen or been hit. He has not been manipulating or mashing on the lesion, and did not notice any bump or pimple-like appearance prior to the recent changes. He reports feeling discomfort in his jaw area and notes that his eyes have been watering while sitting. He denies fever, eye pain, or any drainage from the lesion.  The following sections of the patient's history were reviewed and updated as appropriate: allergies, current medications, past family history, past medical history, past social history, past surgical history, and problem list.         History reviewed. No pertinent past medical history.  Patient Active Problem List   Diagnosis Date Noted   Mixed hyperlipidemia 10/26/2023   Acute idiopathic gout of right knee 10/22/2023   Pure hypercholesterolemia 10/22/2023   Bilateral knee pain 07/02/2022    Past Surgical History:  Procedure Laterality Date   TONSILLECTOMY  2004   VASECTOMY         Home Medications    Prior to Admission medications  Medication Sig Start Date End Date Taking? Authorizing Provider   cephALEXin  (KEFLEX ) 500 MG capsule Take 1 capsule (500 mg total) by mouth 3 (three) times daily for 7 days. 11/04/24 11/11/24 Yes Iola Lukes, FNP  atorvastatin  (LIPITOR) 20 MG tablet Take 1 tablet (20 mg total) by mouth daily. 10/26/23   Rothfuss, Jacob T, PA-C  fluticasone  (FLONASE ) 50 MCG/ACT nasal spray Place 1 spray into both nostrils 2 (two) times daily as needed for rhinitis. 09/26/24 10/26/24  Ival Domino, FNP  indomethacin  (INDOCIN  SR) 75 MG CR capsule Take 1 capsule (75 mg total) by mouth daily with breakfast. 10/22/23   Rothfuss, Lang DASEN, PA-C    Family History Family History  Problem Relation Age of Onset   Lung cancer Mother    COPD Mother    Heart disease Father    Diabetes Father    Prostate cancer Father    Multiple sclerosis Sister     Social History Social History[1]   Allergies   Patient has no known allergies.   Review of Systems Review of Systems  Constitutional:  Negative for fever.  Eyes:  Positive for discharge (left eye watering some). Negative for photophobia, pain, redness, itching and visual disturbance.  Skin:  Positive for color change and wound.  All other systems reviewed and are negative.    Physical Exam Triage Vital Signs ED Triage Vitals  Encounter Vitals Group     BP 11/04/24 0850 130/89     Girls Systolic BP Percentile --  Girls Diastolic BP Percentile --      Boys Systolic BP Percentile --      Boys Diastolic BP Percentile --      Pulse Rate 11/04/24 0850 84     Resp 11/04/24 0850 17     Temp 11/04/24 0850 98 F (36.7 C)     Temp Source 11/04/24 0850 Oral     SpO2 11/04/24 0850 96 %     Weight --      Height --      Head Circumference --      Peak Flow --      Pain Score 11/04/24 0849 0     Pain Loc --      Pain Education --      Exclude from Growth Chart --    No data found.  Updated Vital Signs BP 130/89 (BP Location: Right Arm)   Pulse 84   Temp 98 F (36.7 C) (Oral)   Resp 17   SpO2 96%   Visual  Acuity Right Eye Distance:   Left Eye Distance:   Bilateral Distance:    Right Eye Near:   Left Eye Near:    Bilateral Near:     Physical Exam Vitals reviewed.  Constitutional:      General: He is awake. He is not in acute distress.    Appearance: Normal appearance. He is well-developed. He is not ill-appearing, toxic-appearing or diaphoretic.  HENT:     Head: Normocephalic.     Right Ear: Hearing normal.     Left Ear: Hearing normal.     Nose: Nose normal.     Mouth/Throat:     Mouth: Mucous membranes are moist.  Eyes:     General: Lids are normal. Vision grossly intact. Gaze aligned appropriately.        Left eye: No discharge.     Extraocular Movements: Extraocular movements intact.     Conjunctiva/sclera: Conjunctivae normal.     Left eye: Left conjunctiva is not injected.     Pupils: Pupils are equal, round, and reactive to light.  Cardiovascular:     Rate and Rhythm: Normal rate and regular rhythm.     Heart sounds: Normal heart sounds.  Pulmonary:     Effort: Pulmonary effort is normal.     Breath sounds: Normal breath sounds and air entry.  Musculoskeletal:        General: Normal range of motion.     Cervical back: Normal range of motion and neck supple.  Skin:    General: Skin is warm and dry.     Findings: Lesion present.     Comments: Localized skin lesion noted lateral to the left eye. The lesion is dark brown in color with well-defined borders and irregular shape. There is surrounding erythema and soft tissue swelling extending medially into the left upper jaw region and inferior to the left eye. The surrounding tissue is tender to palpation. No periorbital tenderness is appreciated and there is no evidence of fluctuance or active drainage.   Neurological:     General: No focal deficit present.     Mental Status: He is alert and oriented to person, place, and time.  Psychiatric:        Speech: Speech normal.        Behavior: Behavior is cooperative.          UC Treatments / Results  Labs (all labs ordered are listed, but only abnormal results are displayed) Labs Reviewed -  No data to display  EKG   Radiology No results found.  Procedures Procedures (including critical care time)  Medications Ordered in UC Medications - No data to display  Initial Impression / Assessment and Plan / UC Course  I have reviewed the triage vital signs and the nursing notes.  Pertinent labs & imaging results that were available during my care of the patient were reviewed by me and considered in my medical decision making (see chart for details).  Patient presents with a previously small, stable facial lesion that has acutely developed localized swelling, erythema, and tenderness over the past several days, now associated with ipsilateral jaw discomfort and tearing of the eye but without fever, trauma, visual changes, or pain with eye movement. The evolution from a flat pigmented spot to an inflamed, tender lesion is most consistent with secondary bacterial infection of an underlying nevus or other cutaneous lesion. Although there are currently no signs of orbital involvement, the location on the face places the patient at risk for progression to periorbital cellulitis if not promptly treated. Empiric oral cephalexin  was initiated to treat suspected superficial skin and soft tissue infection, and the patient was instructed to avoid any picking or manipulation of the area. Because the underlying lesion is of unclear etiology and has changed in appearance, referral to dermatology is required for definitive evaluation and consideration of biopsy once the acute infection has improved. Gradual reduction in redness and swelling is expected within 48-72 hours of starting antibiotics. Follow up with primary care or dermatology is advised to ensure clinical improvement and arrange further workup of the lesion. Immediate emergency evaluation is indicated for  development of fever, rapidly spreading redness, severe facial pain, eye pain, pain with eye movement, vision changes, inability to open the eye fully, or worsening swelling despite antibiotic therapy, as these may indicate deeper or orbital infection.  Today's evaluation has revealed no signs of a dangerous process. Discussed diagnosis with patient and/or guardian. Patient and/or guardian aware of their diagnosis, possible red flag symptoms to watch out for and need for close follow up. Patient and/or guardian understands verbal and written discharge instructions. Patient and/or guardian comfortable with plan and disposition.  Patient and/or guardian has a clear mental status at this time, good insight into illness (after discussion and teaching) and has clear judgment to make decisions regarding their care  Documentation was completed with the aid of voice recognition software. Transcription may contain typographical errors.   Final Clinical Impressions(s) / UC Diagnoses   Final diagnoses:  Infected skin lesion     Discharge Instructions      You have an infected skin lesion on your face. This started as a small flat spot but has recently become swollen, red, and tender, which suggests a bacterial skin infection on top of the original spot. Because this area is close to the eye, it is important to treat the infection quickly to prevent it from spreading deeper.  Start the prescribed antibiotic today and take it exactly as directed until it is finished. You should begin to notice some improvement in swelling and redness within two to three days. You may use a clean, warm compress on the area for 10-15 minutes a few times a day to help with discomfort and swelling. Keep the area clean and dry and avoid squeezing, picking, or scratching the lesion, as this can worsen the infection and delay healing.  Make an appointment with dermatology so the underlying spot can be examined more closely  and  possibly biopsied if needed. Follow up with your primary care provider or return for recheck if the area is not clearly improving after a few days on antibiotics, as your medication may need to be changed.  Go to the emergency department right away if you develop fever, rapidly worsening swelling or redness, severe pain, pus-like drainage, trouble opening the eye, eye pain, pain when moving the eye, double or blurry vision, or if the redness begins spreading toward or around the eye. These symptoms can mean the infection is spreading and needs urgent treatment.      ED Prescriptions     Medication Sig Dispense Auth. Provider   cephALEXin  (KEFLEX ) 500 MG capsule Take 1 capsule (500 mg total) by mouth 3 (three) times daily for 7 days. 21 capsule Iola Lukes, FNP      PDMP not reviewed this encounter.      [1]  Social History Tobacco Use   Smoking status: Never    Passive exposure: Never   Smokeless tobacco: Never  Vaping Use   Vaping status: Never Used  Substance Use Topics   Alcohol use: Yes    Alcohol/week: 12.0 standard drinks of alcohol    Types: 12 Standard drinks or equivalent per week    Comment: occ   Drug use: Never     Iola Lukes, FNP 11/04/24 1220  "

## 2024-11-04 NOTE — Discharge Instructions (Addendum)
 You have an infected skin lesion on your face. This started as a small flat spot but has recently become swollen, red, and tender, which suggests a bacterial skin infection on top of the original spot. Because this area is close to the eye, it is important to treat the infection quickly to prevent it from spreading deeper.  Start the prescribed antibiotic today and take it exactly as directed until it is finished. You should begin to notice some improvement in swelling and redness within two to three days. You may use a clean, warm compress on the area for 10-15 minutes a few times a day to help with discomfort and swelling. Keep the area clean and dry and avoid squeezing, picking, or scratching the lesion, as this can worsen the infection and delay healing.  Make an appointment with dermatology so the underlying spot can be examined more closely and possibly biopsied if needed. Follow up with your primary care provider or return for recheck if the area is not clearly improving after a few days on antibiotics, as your medication may need to be changed.  Go to the emergency department right away if you develop fever, rapidly worsening swelling or redness, severe pain, pus-like drainage, trouble opening the eye, eye pain, pain when moving the eye, double or blurry vision, or if the redness begins spreading toward or around the eye. These symptoms can mean the infection is spreading and needs urgent treatment.
